# Patient Record
Sex: Female | Born: 1965 | Race: White | Hispanic: No | Marital: Married | Smoking: Never smoker
Health system: Southern US, Community
[De-identification: ages and names within clinical notes are randomized; demographics above are authoritative.]

## PROBLEM LIST (undated history)

## (undated) DIAGNOSIS — H33319 Horseshoe tear of retina without detachment, unspecified eye: Secondary | ICD-10-CM

## (undated) DIAGNOSIS — E7800 Pure hypercholesterolemia, unspecified: Secondary | ICD-10-CM

## (undated) DIAGNOSIS — R7989 Other specified abnormal findings of blood chemistry: Secondary | ICD-10-CM

## (undated) DIAGNOSIS — R3129 Other microscopic hematuria: Secondary | ICD-10-CM

## (undated) DIAGNOSIS — H269 Unspecified cataract: Secondary | ICD-10-CM

## (undated) DIAGNOSIS — F419 Anxiety disorder, unspecified: Secondary | ICD-10-CM

## (undated) DIAGNOSIS — N809 Endometriosis, unspecified: Secondary | ICD-10-CM

## (undated) HISTORY — DX: Anxiety disorder, unspecified: F41.9

## (undated) HISTORY — DX: Endometriosis, unspecified: N80.9

## (undated) HISTORY — DX: Other specified abnormal findings of blood chemistry: R79.89

## (undated) HISTORY — DX: Horseshoe tear of retina without detachment, unspecified eye: H33.319

## (undated) HISTORY — DX: Pure hypercholesterolemia, unspecified: E78.00

## (undated) HISTORY — DX: Other microscopic hematuria: R31.29

## (undated) HISTORY — DX: Unspecified cataract: H26.9

---

## 1997-12-23 ENCOUNTER — Other Ambulatory Visit: Admission: RE | Admit: 1997-12-23 | Discharge: 1997-12-23 | Payer: Self-pay | Admitting: Obstetrics and Gynecology

## 1999-10-16 ENCOUNTER — Inpatient Hospital Stay (HOSPITAL_COMMUNITY): Admission: AD | Admit: 1999-10-16 | Discharge: 1999-10-19 | Payer: Self-pay | Admitting: Gynecology

## 2001-10-10 ENCOUNTER — Inpatient Hospital Stay (HOSPITAL_COMMUNITY): Admission: AD | Admit: 2001-10-10 | Discharge: 2001-10-12 | Payer: Self-pay | Admitting: Gynecology

## 2001-12-09 ENCOUNTER — Other Ambulatory Visit: Admission: RE | Admit: 2001-12-09 | Discharge: 2001-12-09 | Payer: Self-pay | Admitting: Gynecology

## 2002-02-16 ENCOUNTER — Emergency Department (HOSPITAL_COMMUNITY): Admission: EM | Admit: 2002-02-16 | Discharge: 2002-02-16 | Payer: Self-pay | Admitting: Emergency Medicine

## 2002-02-16 ENCOUNTER — Encounter: Payer: Self-pay | Admitting: Emergency Medicine

## 2003-02-25 ENCOUNTER — Other Ambulatory Visit: Admission: RE | Admit: 2003-02-25 | Discharge: 2003-02-25 | Payer: Self-pay | Admitting: Obstetrics and Gynecology

## 2003-06-10 HISTORY — PX: OTHER SURGICAL HISTORY: SHX169

## 2004-05-11 ENCOUNTER — Other Ambulatory Visit: Admission: RE | Admit: 2004-05-11 | Discharge: 2004-05-11 | Payer: Self-pay | Admitting: Obstetrics and Gynecology

## 2006-02-07 ENCOUNTER — Other Ambulatory Visit: Admission: RE | Admit: 2006-02-07 | Discharge: 2006-02-07 | Payer: Self-pay | Admitting: Obstetrics and Gynecology

## 2007-07-17 ENCOUNTER — Other Ambulatory Visit: Admission: RE | Admit: 2007-07-17 | Discharge: 2007-07-17 | Payer: Self-pay | Admitting: Obstetrics and Gynecology

## 2008-05-06 ENCOUNTER — Encounter: Admission: RE | Admit: 2008-05-06 | Discharge: 2008-05-06 | Payer: Self-pay | Admitting: Obstetrics and Gynecology

## 2008-05-13 ENCOUNTER — Encounter: Admission: RE | Admit: 2008-05-13 | Discharge: 2008-05-13 | Payer: Self-pay | Admitting: Obstetrics and Gynecology

## 2008-09-23 ENCOUNTER — Other Ambulatory Visit: Admission: RE | Admit: 2008-09-23 | Discharge: 2008-09-23 | Payer: Self-pay | Admitting: Obstetrics and Gynecology

## 2009-07-10 HISTORY — PX: LAPAROSCOPIC OOPHERECTOMY: SHX6507

## 2009-08-11 ENCOUNTER — Encounter: Admission: RE | Admit: 2009-08-11 | Discharge: 2009-08-11 | Payer: Self-pay | Admitting: Obstetrics and Gynecology

## 2010-02-28 ENCOUNTER — Ambulatory Visit: Payer: Self-pay | Admitting: Diagnostic Radiology

## 2010-02-28 ENCOUNTER — Emergency Department (HOSPITAL_BASED_OUTPATIENT_CLINIC_OR_DEPARTMENT_OTHER): Admission: EM | Admit: 2010-02-28 | Discharge: 2010-02-28 | Payer: Self-pay | Admitting: Emergency Medicine

## 2010-03-01 ENCOUNTER — Ambulatory Visit (HOSPITAL_COMMUNITY): Admission: AD | Admit: 2010-03-01 | Discharge: 2010-03-01 | Payer: Self-pay | Admitting: Obstetrics and Gynecology

## 2010-03-01 ENCOUNTER — Ambulatory Visit (HOSPITAL_COMMUNITY): Admission: RE | Admit: 2010-03-01 | Discharge: 2010-03-01 | Payer: Self-pay | Admitting: Obstetrics & Gynecology

## 2010-03-01 HISTORY — PX: PELVIC LAPAROSCOPY: SHX162

## 2010-07-31 ENCOUNTER — Encounter: Payer: Self-pay | Admitting: Obstetrics and Gynecology

## 2010-09-14 ENCOUNTER — Other Ambulatory Visit: Payer: Self-pay | Admitting: Obstetrics and Gynecology

## 2010-09-14 DIAGNOSIS — Z1231 Encounter for screening mammogram for malignant neoplasm of breast: Secondary | ICD-10-CM

## 2010-09-22 LAB — CBC
MCV: 90.3 fL (ref 78.0–100.0)
Platelets: 225 10*3/uL (ref 150–400)

## 2010-09-23 LAB — DIFFERENTIAL
Basophils Absolute: 0.1 10*3/uL (ref 0.0–0.1)
Eosinophils Absolute: 0.1 10*3/uL (ref 0.0–0.7)
Eosinophils Relative: 1 % (ref 0–5)
Lymphocytes Relative: 23 % (ref 12–46)
Monocytes Absolute: 0.4 10*3/uL (ref 0.1–1.0)
Neutrophils Relative %: 71 % (ref 43–77)

## 2010-09-23 LAB — CBC
Hemoglobin: 14.2 g/dL (ref 12.0–15.0)
MCHC: 33.6 g/dL (ref 30.0–36.0)
Platelets: 235 10*3/uL (ref 150–400)
RDW: 13 % (ref 11.5–15.5)
WBC: 7.7 10*3/uL (ref 4.0–10.5)

## 2010-09-23 LAB — URINALYSIS, ROUTINE W REFLEX MICROSCOPIC
Bilirubin Urine: NEGATIVE
Hgb urine dipstick: NEGATIVE
Ketones, ur: NEGATIVE mg/dL
Protein, ur: NEGATIVE mg/dL

## 2010-09-23 LAB — COMPREHENSIVE METABOLIC PANEL
Calcium: 9 mg/dL (ref 8.4–10.5)
Creatinine, Ser: 0.9 mg/dL (ref 0.4–1.2)
Potassium: 4.3 mEq/L (ref 3.5–5.1)
Sodium: 141 mEq/L (ref 135–145)
Total Bilirubin: 0.7 mg/dL (ref 0.3–1.2)
Total Protein: 7.9 g/dL (ref 6.0–8.3)

## 2010-09-23 LAB — LIPASE, BLOOD: Lipase: 62 U/L (ref 23–300)

## 2010-09-23 LAB — WET PREP, GENITAL: Clue Cells Wet Prep HPF POC: NONE SEEN

## 2010-09-23 LAB — GC/CHLAMYDIA PROBE AMP, GENITAL: GC Probe Amp, Genital: NEGATIVE

## 2010-11-02 ENCOUNTER — Ambulatory Visit
Admission: RE | Admit: 2010-11-02 | Discharge: 2010-11-02 | Disposition: A | Discharge status: Home / Self Care | Payer: 59 | Source: Ambulatory Visit | Attending: Obstetrics and Gynecology | Admitting: Obstetrics and Gynecology

## 2010-11-02 DIAGNOSIS — Z1231 Encounter for screening mammogram for malignant neoplasm of breast: Secondary | ICD-10-CM

## 2010-11-25 NOTE — Discharge Summary (Signed)
[facility]  Patient:    Kristin Greene, Kristin Greene                 MRN: [id number] Adm. Date:  [id number] Disc. Date: [id number] Attending:  Tonye Royalty Dictator:   Antony Contras, RNC, [facility]                           Discharge Summary  DISCHARGE DIAGNOSES:          1. Intrauterine pregnancy at term.                               2. Delivery of viable infant.  PROCEDURE:                    Normal spontaneous vaginal delivery over a midline episiotomy with third degree extension.  HISTORY OF PRESENT ILLNESS:   The patient is a 45 year old primigravida with an LMP of January 19, 1999, [facility] October 26, 1999.  Prenatal course was complicated by an echogenic focus in the left ventricle of the fetal heart which was seen on ultrasound.  The patient subsequently had a normal amniocentesis.  She is also h negative.  PRENATAL LABORATORY DATA:     Blood type O negative, antibody screen negative, rubella positive, RPR, HBSAG, HIV negative.  MSAFP within normal limits. Normal genetic amniocentesis.  GBS negative.  HOSPITAL COURSE:              The patient was admitted with spontaneous rupture of membranes on October 17, 1999, with clear amniotic fluid.  The patient was completely dilated, completely effaced, at +1 station.  She rapidly progressed to delivery of Apgars 9 and 34 female infant, weight 6 pounds 14 ounces over a midline episiotomy  with a third degree extension.  Estimated blood loss was 300 cc.  Postpartum course was within normal limits, the patient remained afebrile.  The baby was B positive. CBC was 25.9, hemoglobin 9.1, WBC 10.8, and platelets 233.  The patient did receive RhoGAM prior to discharge.  She was able to be discharged on her second postpartum day in satisfactory condition with her infant.  DISPOSITION:                  Follow up in six weeks.  Continue prenatal vitamins and iron, and Motrin for pain. DD:  11/04/99 TD:   11/09/99 Job: [id number]

## 2010-11-25 NOTE — Discharge Summary (Signed)
[facility]  Patient:    Kristin Greene, Kristin Greene Visit Number: [id number] MRN: [id number]          Service Type: OBS Location: [id number] Attending Physician:  Merrily Pew Dictated by:   Antony Contras, [facility] Admit Date:  10/10/2001 Discharge Date: 10/12/2001                             Discharge Summary  DISCHARGE DIAGNOSIS:          Intrauterine pregnancy at term, spontaneous onset of labor.  PROCEDURE:                    Normal spontaneous vaginal delivery of viable infant over intact perineum with repair of midline laceration.  HISTORY OF PRESENT ILLNESS:   The patient is a 45 year old gravida 2, para 1-0-0-1 with LMP January 10, 2001 and [facility] October 16, 2001.  Prenatal risk factors include history of beta Strep UTI during current pregnancy, Rh negative, advanced maternal age.  Labs:  Blood type O-, antibody screen negative, toxo negative.  RPR, HBsAg, HIV nonreactive.  Amniocentesis normal chromosomes. GBS positive.  HOSPITAL COURSE:              The patient was admitted October 10, 2001 with spontaneous onset of labor.  She quickly progressed to complete dilatation. Delivered an Apgar 31 and 19 female infant weighing 7 pounds and 4 ounces over intact perineum with repair of second-degree midline laceration.  POSTPARTUM COURSE:            The patient remained afebrile, had no difficulty voiding, was able to be discharged on her second postpartum day in satisfactory condition.  She did receive RhoGAM prior to discharge.  LABORATORY DATA:              CBC:  Hematocrit 34.5, hemoglobin 11.8, WBC 16.3, platelets 212.  DISPOSITION:                  Follow up in six weeks.  Continue prenatal vitamins and iron and Motrin for pain. Dictated by:   Antony Contras, [facility] Attending Physician:  Merrily Pew DD:  10/31/01 TD:  11/01/01 Job: [id number]

## 2011-02-10 ENCOUNTER — Other Ambulatory Visit: Payer: Self-pay | Admitting: Obstetrics and Gynecology

## 2011-02-10 ENCOUNTER — Ambulatory Visit (HOSPITAL_COMMUNITY)
Admission: RE | Admit: 2011-02-10 | Discharge: 2011-02-10 | Disposition: A | Discharge status: Home / Self Care | Payer: 59 | Source: Ambulatory Visit | Attending: Obstetrics and Gynecology | Admitting: Obstetrics and Gynecology

## 2011-02-10 DIAGNOSIS — N83209 Unspecified ovarian cyst, unspecified side: Secondary | ICD-10-CM

## 2011-02-10 DIAGNOSIS — Z30431 Encounter for routine checking of intrauterine contraceptive device: Secondary | ICD-10-CM | POA: Insufficient documentation

## 2011-02-10 DIAGNOSIS — R1031 Right lower quadrant pain: Secondary | ICD-10-CM | POA: Insufficient documentation

## 2011-11-27 ENCOUNTER — Other Ambulatory Visit: Payer: Self-pay | Admitting: Obstetrics and Gynecology

## 2011-11-27 DIAGNOSIS — Z1231 Encounter for screening mammogram for malignant neoplasm of breast: Secondary | ICD-10-CM

## 2012-05-01 ENCOUNTER — Ambulatory Visit
Admission: RE | Admit: 2012-05-01 | Discharge: 2012-05-01 | Disposition: A | Discharge status: Home / Self Care | Payer: BC Managed Care – PPO | Source: Ambulatory Visit | Attending: Obstetrics and Gynecology | Admitting: Obstetrics and Gynecology

## 2012-05-01 DIAGNOSIS — Z1231 Encounter for screening mammogram for malignant neoplasm of breast: Secondary | ICD-10-CM

## 2013-03-24 ENCOUNTER — Other Ambulatory Visit: Payer: Self-pay

## 2013-03-24 DIAGNOSIS — Z1231 Encounter for screening mammogram for malignant neoplasm of breast: Secondary | ICD-10-CM

## 2013-05-14 ENCOUNTER — Ambulatory Visit (INDEPENDENT_AMBULATORY_CARE_PROVIDER_SITE_OTHER): Payer: BC Managed Care – PPO | Admitting: Obstetrics and Gynecology

## 2013-05-14 ENCOUNTER — Ambulatory Visit: Payer: Self-pay | Admitting: Obstetrics and Gynecology

## 2013-05-14 ENCOUNTER — Encounter: Payer: Self-pay | Admitting: Obstetrics and Gynecology

## 2013-05-14 ENCOUNTER — Ambulatory Visit
Admission: RE | Admit: 2013-05-14 | Discharge: 2013-05-14 | Disposition: A | Discharge status: Home / Self Care | Payer: BC Managed Care – PPO | Source: Ambulatory Visit

## 2013-05-14 ENCOUNTER — Ambulatory Visit: Payer: BC Managed Care – PPO

## 2013-05-14 VITALS — BP 118/80 | HR 70 | Ht 66.5 in | Wt 137.5 lb

## 2013-05-14 DIAGNOSIS — Z30431 Encounter for routine checking of intrauterine contraceptive device: Secondary | ICD-10-CM

## 2013-05-14 DIAGNOSIS — Z0000 Encounter for general adult medical examination without abnormal findings: Secondary | ICD-10-CM

## 2013-05-14 DIAGNOSIS — Z01419 Encounter for gynecological examination (general) (routine) without abnormal findings: Secondary | ICD-10-CM

## 2013-05-14 DIAGNOSIS — Z1231 Encounter for screening mammogram for malignant neoplasm of breast: Secondary | ICD-10-CM

## 2013-05-14 LAB — POCT URINALYSIS DIPSTICK
Glucose, UA: NEGATIVE
Ketones, UA: NEGATIVE
Leukocytes, UA: NEGATIVE
Nitrite, UA: NEGATIVE
Protein, UA: NEGATIVE
Urobilinogen, UA: NEGATIVE
pH, UA: 5

## 2013-05-14 LAB — HEMOGLOBIN, FINGERSTICK: Hemoglobin, fingerstick: 13.8 g/dL (ref 12.0–16.0)

## 2013-05-14 NOTE — Progress Notes (Signed)
Patient ID: Kristin Greene, female   DOB: [birth year], 47 y.o.   MRN: [id number] GYNECOLOGY VISIT  PCP:   Creola Corn, MD  Referring provider:   HPI: 47 y.o.   Married  Caucasian  female   G2P2002 with Patient's last menstrual period was 07/10/2002.   here for  AEX.  Mirena IUD expired in October 2014. Wants to have a new IUD. Not sexually active very often, once a year or less.  Husband has issues.  Last IUD exchange was difficulty.  Took antianxiety medication.   Leg shaking and anxiety.  Patient has panic with giving injections at work. Took Prozac and did not like how it took her emotions away. Xanax helps her to deal with this. Has seen a Veterinary surgeon.   Does not need a refill at this time.   Has a history of an ovarian torsion.   Hgb:     13.8 Urine:   Neg  GYNECOLOGIC HISTORY: Patient's last menstrual period was 07/10/2002. Sexually active:  yes Partner preference: female Contraception:   Mirena inserted10-21-09 Menopausal hormone therapy: n/a DES exposure:  no  Blood transfusions:   no Sexually transmitted diseases:  no  GYN Procedures:  Laparoscopy/LSO 06/2003 secondary to torsion and endometriosis Mammogram:    05-14-13 with [facility]             Pap:   05-01-12 wnl:neg HR HPV History of abnormal pap smear:  no   OB History   Grav Para Term Preterm Abortions TAB SAB Ect Mult Living   2 2 2       2        LIFESTYLE: Exercise:    walking           Tobacco:    no Alcohol:       2 drinks per mont Drug use:    no  OTHER HEALTH MAINTENANCE: Tetanus/TDap:   04-06-10 Gardisil:  NA Influenza:    04/2013 Zostavax:   n/a  Bone density:  never Colonoscopy:  never  Cholesterol check:  03/2013 borderline at 220.    Family History  Problem Relation Age of Onset  . Hypertension Father   . Heart failure Maternal Grandmother     CHF  . Heart failure Paternal Grandmother     CHF  . Rheum arthritis Paternal Grandmother   . Hyperlipidemia Mother   .  Osteoporosis Mother     There are no active problems to display for this patient.  Past Medical History  Diagnosis Date  . Endometriosis   . Anxiety     panic    Past Surgical History  Procedure Laterality Date  . Pelvic laparoscopy  03-01-10    LSO secondary torsion, endometriosis--Dr. Aram Beecham Romine  . Abdomnioplasty  06/2003    ALLERGIES: Sulfa antibiotics and Penicillins  Current Outpatient Prescriptions  Medication Sig Dispense Refill  . ALPRAZolam (XANAX) 0.25 MG tablet Take 0.25 mg by mouth as needed for sleep.      Marland Kitchen levonorgestrel (MIRENA) 20 MCG/24HR IUD 1 each by Intrauterine route once.       No current facility-administered medications for this visit.     ROS:  Pertinent items are noted in HPI.  SOCIAL HISTORY:  Dentist.  PHYSICAL EXAMINATION:    BP 118/80  Pulse 70  Ht 5' 6.5" (1.689 m)  Wt 137 lb 8 oz (62.37 kg)  BMI 21.86 kg/m2  LMP 07/10/2002   Wt Readings from Last 3 Encounters:  05/14/13 137 lb 8 oz (  62.37 kg)     Ht Readings from Last 3 Encounters:  05/14/13 5' 6.5" (1.689 m)    General appearance: alert, cooperative and appears stated age Head: Normocephalic, without obvious abnormality, atraumatic Neck: no adenopathy, supple, symmetrical, trachea midline and thyroid not enlarged, symmetric, no tenderness/mass/nodules Lungs: clear to auscultation bilaterally Breasts: Inspection negative, No nipple retraction or dimpling, No nipple discharge or bleeding, No axillary or supraclavicular adenopathy, Normal to palpation without dominant masses Heart: regular rate and rhythm Abdomen: soft, non-tender; no masses,  no organomegaly Extremities: extremities normal, atraumatic, no cyanosis or edema Skin: Skin color, texture, turgor normal. No rashes or lesions Lymph nodes: Cervical, supraclavicular, and axillary nodes normal. No abnormal inguinal nodes palpated Neurologic: Grossly normal  Pelvic: External genitalia:  no lesions               Urethra:  normal appearing urethra with no masses, tenderness or lesions              Bartholins and Skenes: normal                 Vagina: normal appearing vagina with normal color and discharge, no lesions              Cervix: normal appearance.  IUD strings noted.               Pap and high risk HPV testing done: no.            Bimanual Exam:  Uterus:  uterus is normal size, shape, consistency and nontender                                      Adnexa: normal adnexa in size, nontender and no masses                                      Rectovaginal: Confirms                                      Anus:  normal sphincter tone, no lesions  ASSESSMENT  Normal gynecologic exam. Mirena IUD expired.   Not very sexually active.  Anxiety affects patient professionally from time to time.  Has had evaluation. History of ovarian torsion.   PLAN  Mammogram was done today.    Pap smear and high risk HPV testing not indicated.    Discussion of Mirena IUD exchange versus low dose OCPS.   Counseled on Mirena IUD removal and reinsertion.   Patient told to use condoms for contraception if sexually active prior to IUD insertion or to abstain.  Will plan to have patient take Xanax 0.25 mg, take 2, 30 minutes prior to procedure.  Will plan to use procedure room and do a paracervical block.  Told to eat and take 600 - 800 mg OTC ibuprofen before appointment.  Patient will also need to sign a consent form prior to coming in for her procedure.   If patient contacts the office for Xanax, will give a limited prescription.  Return annually or prn   An After Visit Summary was printed and given to the patient.

## 2013-05-14 NOTE — Patient Instructions (Signed)

## 2013-05-30 ENCOUNTER — Telehealth: Payer: Self-pay | Admitting: Obstetrics and Gynecology

## 2013-05-30 NOTE — Telephone Encounter (Signed)
Patient calling to inquire about precert for Mirena?

## 2013-05-30 NOTE — Telephone Encounter (Signed)
Message left to return call to Kristin Greene at [phone].    

## 2013-05-30 NOTE — Telephone Encounter (Signed)
Carolynn, I can see precert is completed, but I can't see a patient amount? Is there one?

## 2013-06-02 NOTE — Telephone Encounter (Signed)
Spoke with patient. She has Mirena currently in place. She is abstaining from sex since expired. She has Xanax at home. She will take it if neccessary and come and sign consent forms prior. Appointment scheduled for 12/24 at 11. Procedure room scheduled as well. She requests Wednesday appointment only since off that day. First available is 12/24.

## 2013-06-02 NOTE — Telephone Encounter (Signed)
Please be sure that I have plenty of time for this procedure.  Patient had a difficult insertion the last time per her report.  Thanks!

## 2013-06-03 NOTE — Telephone Encounter (Signed)
Patient cancel for December 24 and wants to reschedule for january

## 2013-06-03 NOTE — Telephone Encounter (Signed)
French Ana,  I can see the patient on 06/11/13 at 8:00 am and open a slot at that time if it is better for her.  Otherwise, the January appointment is OK.

## 2013-06-03 NOTE — Telephone Encounter (Signed)
Spoke with pt about appt time. Pt offered 07-11-13 at 7 am but declined as she has to work. Scheduled 07-16-13 at 10 am as pt has off on Wednesdays. This is a 30 minute slot. Is that enough time?

## 2013-06-03 NOTE — Telephone Encounter (Signed)
LMTCB for appt options.  aa   (Considering 07-11-13 at 7am--hour long slot, 07-16-13 at 10 am--30 min slot, 07-21-12 at 7 am, 12:45, or 1:45.)

## 2013-06-03 NOTE — Telephone Encounter (Signed)
Kristin Greene and Kristin Greene,  06/11/13 at 8:00 am will be an option for me to do the IUD exchange if it works for the patient.  [employer]

## 2013-06-03 NOTE — Telephone Encounter (Signed)
Dr. Edward Jolly,  I scheduled her for Wednesday December 24 at 11:00. The patient requested only Wednesday appointments and she was not happy with having to wait until 12/24 because her mirena is expired, but this was the first available Wednesday with the time available. She will be the last patient of the day. Will this work? I feel that she will be very upset if I attempt to put her into January.

## 2013-06-04 NOTE — Telephone Encounter (Signed)
I spoke with patient and she is agreeable to that time and date. Instructions given. She will come early to sign release. She has xanax to take at home. Motrin instructions given. Motrin=Advil=Ibuprofen 800 mg (Can purchase over the counter, you will need four 200 mg pills)  Take with food. She has xanax at home and will take as prescribed by Dr. Edward Jolly at visit. Will come while on her lunch break to sign consent.   Thank you very much Dr. Edward Jolly, patient is very appreciative for early time slot!

## 2013-06-09 ENCOUNTER — Telehealth: Payer: Self-pay | Admitting: *Deleted

## 2013-06-09 NOTE — Telephone Encounter (Signed)
Patient came to office and signed consent for Mirena insertion.  She states she has her own medication to take prior to appointment on Wednesday.  Routing to provider for final review. Patient agreeable to disposition. Will close encounter

## 2013-06-11 ENCOUNTER — Ambulatory Visit (INDEPENDENT_AMBULATORY_CARE_PROVIDER_SITE_OTHER): Payer: BC Managed Care – PPO | Admitting: Obstetrics and Gynecology

## 2013-06-11 ENCOUNTER — Encounter: Payer: Self-pay | Admitting: Obstetrics and Gynecology

## 2013-06-11 VITALS — BP 118/70 | HR 84 | Ht 66.5 in | Wt 140.0 lb

## 2013-06-11 DIAGNOSIS — N912 Amenorrhea, unspecified: Secondary | ICD-10-CM

## 2013-06-11 DIAGNOSIS — Z30433 Encounter for removal and reinsertion of intrauterine contraceptive device: Secondary | ICD-10-CM

## 2013-06-11 LAB — POCT URINE PREGNANCY: Preg Test, Ur: NEGATIVE

## 2013-06-11 NOTE — Progress Notes (Signed)
Patient ID: Kristin Greene, female   DOB: [birth year], 47 y.o.   MRN: [id number]   Subjective  Here for IUD removal and exchange. Mirena IUD expired in October 2014.  Consent was signed on 06/09/13.  Took Xanax 0.25 mg, took 2 this am. Took ibuprofen 800 mg this am also.  No menses.   Not having frequent intercourse.    Objective  UPT negative  BP 118/70 Pulse 84  Exam - small mid position uterus, nontender.  No adnexal masses, nontender.  Procedure - IUD removal and reinsertion of new Mirena IUD.   Lot [id number], Expiration 02/2015.  Verbal consent re-reviewed with patient.  Graves speculum placed.  Sterile prep of cervix with Hibiclens.   Paracervical block performed with 10 cc total of lidocaine 1% plain. Tenaculum to anterior cervical lip. IUD removed without difficulty and discarded. Uterus sounded to just over 8 cm. Mirena IUD placed without difficulty. Strings trimmed. Bimanual exam repeated.  No change other than IUD strings palpable. No complications. Tolerated well.  Assesment  Mirena IUD removed. New Mirena IUD placed.  Plan  Instructions/precautions given. Patient will follow up in 4 weeks and sooner prn. Patient's mother is here to drive the patient home today.

## 2013-06-11 NOTE — Patient Instructions (Signed)
Intrauterine Device Insertion Most often, an intrauterine device (IUD) is inserted into the uterus to prevent pregnancy. There are 2 types of IUDs available:  Copper IUD. This type of IUD creates an environment that is not favorable to sperm survival. The mechanism of action of the copper IUD is not known for certain. It can stay in place for 10 years.  Hormone IUD. This type of IUD contains the hormone progestin (synthetic progesterone). The progestin thickens the cervical mucus and prevents sperm from entering the uterus, and it also thins the uterine lining. There is no evidence that the hormone IUD prevents implantation. The hormone IUD can stay in place for up to 5 years. An IUD is the most cost-effective birth control if left in place for the full duration. It may be removed at any time. LET YOUR CAREGIVER KNOW ABOUT:  Sensitivity to metals.  Medicines taken including herbs, eyedrops, over-the-counter medicines, and creams.  Use of steroids (by mouth or creams).  Previous problems with anesthetics or numbing medicine.  Previous gynecological surgery.  History of blood clots or clotting disorders.  Possibility of pregnancy.  Menstrual irregularities.  Concerns regarding unusual vaginal discharge or odors.  Previous experience with an IUD.  Other health problems. RISKS AND COMPLICATIONS  Accidental puncture (perforation) of the uterus.  Accidental placement of the IUD either in the muscle layer of the uterus (myometrium) or outside the uterus. If this happen, the IUD can be found essentially floating around the bowels. When this happens, the IUD must be taken out surgically.  The IUD may fall out of the uterus (expulsion). This is more common in women who have recently had a child.   Pregnancy in the fallopian tube (ectopic). BEFORE THE PROCEDURE  Schedule the IUD insertion for when you will have your menstrual period or right after, to make sure you are not pregnant.  Placement of the IUD is better tolerated shortly after a menstrual cycle.  You may need to take tests or be examined to make sure you are not pregnant.  You may be required to take a pregnancy test.  You may be required to get checked for sexually transmitted infections (STIs) prior to placement. Placing an IUD in someone who has an infection can make an infection worse.  You may be given a pain reliever to take 1 or 2 hours before the procedure.  An exam will be performed to determine the size and position of your uterus.  Ask your caregiver about changing or stopping your regular medicines. PROCEDURE   A tool (speculum) is placed in the vagina. This allows your caregiver to see the lower part of the uterus (cervix).  The cervix is prepped with a medicine that lowers the risk of infection.  You may be given a medicine to numb each side of the cervix (intracervical or paracervical block). This is used to block and control any discomfort with insertion.  A tool (uterine sound) is inserted into the uterus to determine the length of the uterine cavity and the direction the uterus may be tilted.  A slim instrument (IUD inserter) is inserted through the cervical canal and into your uterus.  The IUD is placed in the uterine cavity and the insertion device is removed.  The nylon string that is attached to the IUD, and used for eventual IUD removal, is trimmed. It is trimmed so that it lays high in the vagina, just outside the cervix. AFTER THE PROCEDURE  You may have bleeding after the   procedure. This is normal. It varies from light spotting for a few days to menstrual-like bleeding.  You may have mild cramping.  Practice checking the string coming out of the cervix to make sure the IUD remains in the uterus. If you cannot feel the string, you should schedule a "string check" with your caregiver.  If you had a hormone IUD inserted, expect that your period may be lighter or nonexistent  within a year's time (though this is not always the case). There may be delayed fertility with the hormone IUD as a result of its progesterone effect. When you are ready to become pregnant, it is suggested to have the IUD removed up to 1 year in advance.  Yearly exams are advised. Document Released: 02/22/2011 Document Revised: 09/18/2011 Document Reviewed: 12/15/2012 [employer] Patient Information 2014 [employer].  

## 2013-07-02 ENCOUNTER — Ambulatory Visit: Payer: BC Managed Care – PPO | Admitting: Obstetrics and Gynecology

## 2013-07-02 ENCOUNTER — Ambulatory Visit: Payer: BC Managed Care – PPO

## 2013-07-16 ENCOUNTER — Ambulatory Visit: Payer: BC Managed Care – PPO | Admitting: Obstetrics and Gynecology

## 2013-07-22 ENCOUNTER — Telehealth: Payer: Self-pay | Admitting: Obstetrics and Gynecology

## 2013-07-22 NOTE — Telephone Encounter (Signed)
Pt would like to reschedule her follow up appt for reck scheduled for tomorrow with dr Quincy Simmonds because of the weather. Would like to come in next Wednesday if possible.

## 2013-07-22 NOTE — Telephone Encounter (Signed)
Spoke with pt to reschedule appt to next Wed. 07-30-13 at 10 am with BS. Pt agreeable.

## 2013-07-23 ENCOUNTER — Ambulatory Visit: Payer: BC Managed Care – PPO | Admitting: Obstetrics and Gynecology

## 2013-07-30 ENCOUNTER — Encounter: Payer: Self-pay | Admitting: Obstetrics and Gynecology

## 2013-07-30 ENCOUNTER — Ambulatory Visit (INDEPENDENT_AMBULATORY_CARE_PROVIDER_SITE_OTHER): Payer: BC Managed Care – PPO | Admitting: Obstetrics and Gynecology

## 2013-07-30 VITALS — BP 118/71 | HR 74 | Resp 16 | Wt 140.0 lb

## 2013-07-30 DIAGNOSIS — Z30431 Encounter for routine checking of intrauterine contraceptive device: Secondary | ICD-10-CM

## 2013-07-30 NOTE — Progress Notes (Signed)
Patient ID: Kristin Greene, female   DOB: 04-02-1966, 48 y.o.   MRN: [id number]  Subjective   Here for Mirena IUD check.  Mirena IUD eschange performed on 06/11/13. Had brief bleeding with procedure and then none since. No pain or cramping.  Has not checked IUD strings. Not SA.  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  Objective  Pelvic exam Normal external genitalia and urethra.  Cervix - IUD strings seen.  Bimanual examination - No IUD palpable.  Small and nontender uterus.  Exam somewhat difficult due to patient inability to relax abdominal wall. No adnexal masses.  Assessment  Doing well with Mirena IUD.  Plan  Follow up for any problems and for annual examination in November 2015.  10 minutes face to face time of which over 50% was spent in counseling.

## 2013-07-30 NOTE — Patient Instructions (Signed)
Please call for any problems!

## 2013-09-10 ENCOUNTER — Other Ambulatory Visit: Payer: Self-pay | Admitting: Obstetrics and Gynecology

## 2013-09-10 MED ORDER — ALPRAZOLAM 0.25 MG PO TABS: 0.2500 mg | ORAL_TABLET | ORAL | Status: DC | PRN

## 2013-09-10 NOTE — Telephone Encounter (Signed)
Please advise, Annual Exam 05/14/2013   Says  if patient contacts the office for refill will give a limited Rx.

## 2013-09-10 NOTE — Telephone Encounter (Signed)
Xanax refill requested by patient. [place]

## 2013-09-11 NOTE — Telephone Encounter (Signed)
Rx faxed to [facility] and left a voice message telling patient.

## 2013-09-12 NOTE — Telephone Encounter (Signed)
T/C to patient to let her know that her Rx was faxed to [facility]. Pt was asking if she could get a refill on OCP that she had taken before.after talking with Dr. Quincy Simmonds I called the patient back and scheduled an appointment to discuss her medication.

## 2013-09-29 ENCOUNTER — Telehealth: Payer: Self-pay | Admitting: Obstetrics and Gynecology

## 2013-09-29 NOTE — Telephone Encounter (Signed)
Patient is asking for refill status. Please call patient when this has been called in. Patient says she requested a refill thru [facility].

## 2013-09-29 NOTE — Telephone Encounter (Signed)
Collier Flowers, CMA at 09/12/2013 5:11 PM    Status: Signed        T/C to patient to let her know that her Rx was faxed to [facility].  Pt was asking if she could get a refill on OCP that she had taken before.after talking with Dr. Quincy Simmonds  I called the patient back and scheduled an appointment to discuss her medication.     -Patient canceled appt she had  10/01/13 to discuss Rx. - She needs OV to discuss refills with Dr. Quincy Simmonds - Left message to pt to call back

## 2013-09-30 NOTE — Telephone Encounter (Signed)
Kristin Greene,  I sent a message to the patient through My Chart asking her what pill she wanted.  I guess she did not receive the message.  Starla,  Please pull the patient's paper chart and place it on my desk on 10/01/13 so I can review it.  Thanks to both,  [place]

## 2013-09-30 NOTE — Telephone Encounter (Signed)
Patient called back and states she send e-mail to Dr. Quincy Simmonds thru [facility] explaining why she uses OCP even when she has an IUD. I am not able to see those e-mails. - Patient states it is not rush. I told her I would send message to Dr. Quincy Simmonds, she will respond by Thursday. We will call pt if Dr. Has any questions. -Patient agreed.  [facility] [address].

## 2013-10-01 ENCOUNTER — Ambulatory Visit: Payer: BC Managed Care – PPO | Admitting: Obstetrics and Gynecology

## 2013-10-01 ENCOUNTER — Other Ambulatory Visit: Payer: Self-pay | Admitting: Obstetrics and Gynecology

## 2013-10-01 MED ORDER — NORETHIN-ETH ESTRAD-FE BIPHAS 1 MG-10 MCG / 10 MCG PO TABS: 1.0000 | ORAL_TABLET | Freq: Every day | ORAL | Status: DC

## 2013-10-01 NOTE — Telephone Encounter (Signed)
Tiffany,  Will you pull this paper chart for me/  Thanks!

## 2013-10-01 NOTE — Telephone Encounter (Signed)
Kristin Greene,   Please inform the patient I will give her a prescription for a pack of LoLoEstrin.  I will send in to her pharmacy through [facility]. I reviewed her paper chart and found that she took this for cyst formation in the past.  [place],   [place].

## 2013-10-02 NOTE — Telephone Encounter (Signed)
Spoke with patient. Advised prescription for LoLoEstrin was sent to pharmacy of choice. Patient verbalizes understanding and is agreeable.  Routing to provider for final review. Patient agreeable to disposition. Will close encounter

## 2013-12-10 ENCOUNTER — Encounter: Payer: Self-pay | Admitting: Obstetrics and Gynecology

## 2013-12-11 ENCOUNTER — Telehealth: Payer: Self-pay | Admitting: *Deleted

## 2013-12-11 NOTE — Telephone Encounter (Signed)
Dr Quincy Simmonds, this is a duplicate of previous message because I believe I sent it to patient incorrectly.  Routing to provider for final review. Patient agreeable to disposition. Will close encounter

## 2013-12-11 NOTE — Telephone Encounter (Signed)
Last TSH 04-2011, in [place] chart. Message to patient needs OV to discuss symptoms.  Routing to provider for final review. Patient agreeable to disposition. Will close encounter

## 2013-12-11 NOTE — Telephone Encounter (Signed)
See separate MyChart message to patient.  Routing to provider for final review. Patient agreeable to disposition. Will close encounter

## 2013-12-24 ENCOUNTER — Ambulatory Visit (INDEPENDENT_AMBULATORY_CARE_PROVIDER_SITE_OTHER): Payer: BC Managed Care – PPO | Admitting: Obstetrics and Gynecology

## 2013-12-24 ENCOUNTER — Encounter: Payer: Self-pay | Admitting: Obstetrics and Gynecology

## 2013-12-24 VITALS — BP 120/68 | HR 70 | Ht 66.5 in | Wt 139.6 lb

## 2013-12-24 DIAGNOSIS — L659 Nonscarring hair loss, unspecified: Secondary | ICD-10-CM

## 2013-12-24 DIAGNOSIS — R55 Syncope and collapse: Secondary | ICD-10-CM

## 2013-12-24 DIAGNOSIS — L708 Other acne: Secondary | ICD-10-CM

## 2013-12-24 DIAGNOSIS — L709 Acne, unspecified: Secondary | ICD-10-CM

## 2013-12-24 MED ORDER — ESTRADIOL 1 MG PO TABS: 1.0000 mg | ORAL_TABLET | Freq: Every day | ORAL | Status: DC

## 2013-12-24 NOTE — Progress Notes (Signed)
Patient ID: Kristin Greene, female   DOB: Sep 11, 1965, 48 y.o.   MRN: [id number] GYNECOLOGY  VISIT   HPI: 48 y.o.   Married  Caucasian  female   G2P2002 with No LMP recorded. Patient is not currently having periods (Reason: IUD).   here for consult.    Notes hair thinning and drying for 6 - 8 months.  Acne.  Saw an esthetician.  Anxiety.  No reason for this.  No hot flashes.  Feels a little warmer at night.   Took OCPs recently only once due to ovarian cyst type pain.   GYNECOLOGIC HISTORY: No LMP recorded. Patient is not currently having periods (Reason: IUD). Contraception:  Mirena IUD  Menopausal hormone therapy: n/a        OB History   Grav Para Term Preterm Abortions TAB SAB Ect Mult Living   2 2 2       2          There are no active problems to display for this patient.   Past Medical History  Diagnosis Date  . Endometriosis   . Anxiety     panic    Past Surgical History  Procedure Laterality Date  . Pelvic laparoscopy  03-01-10    LSO secondary torsion, endometriosis--Dr. Caren Griffins Romine  . Abdomnioplasty  06/2003    Current Outpatient Prescriptions  Medication Sig Dispense Refill  . ALPRAZolam (XANAX) 0.25 MG tablet Take 1 tablet (0.25 mg total) by mouth as needed for sleep.  30 tablet  0  . levonorgestrel (MIRENA) 20 MCG/24HR IUD 1 each by Intrauterine route once.      . Norethindrone-Ethinyl Estradiol-Fe Biphas (LO LOESTRIN FE) 1 MG-10 MCG / 10 MCG tablet Take 1 tablet by mouth daily.  1 Package  0   No current facility-administered medications for this visit.     ALLERGIES: Sulfa antibiotics and Penicillins  Family History  Problem Relation Age of Onset  . Hypertension Father   . Heart failure Maternal Grandmother     CHF  . Heart failure Paternal Grandmother     CHF  . Rheum arthritis Paternal Grandmother   . Hyperlipidemia Mother   . Osteoporosis Mother     History   Social History  . Marital Status: Married    Spouse Name: N/A   Number of Children: N/A  . Years of Education: N/A   Occupational History  . Not on file.   Social History Main Topics  . Smoking status: Never Smoker   . Smokeless tobacco: Never Used  . Alcohol Use: No     Comment: 2 drinks per month  . Drug Use: No  . Sexual Activity: Yes    Partners: Male    Birth Control/ Protection: IUD     Comment: Mirena--inserted10-21-09(Lot #[id number] Exp 08/2010)   Other Topics Concern  . Not on file   Social History Narrative  . No narrative on file    ROS:  Pertinent items are noted in HPI.  PHYSICAL EXAMINATION:    BP 120/68  Pulse 70  Ht 5' 6.5" (1.689 m)  Wt 139 lb 9.6 oz (63.322 kg)  BMI 22.20 kg/m2     General appearance: alert, cooperative and appears stated age  ASSESSMENT  Alopecia.  Acne.  Vasomotor instability.  Mirena IUD patient. Anxiety.   PLAN  Will check FSH and TSH.  I recommend Estradiol 1 mg daily.  See Epic orders.  Risks of breast cancer, DVT, PE, MI and stroke reviewed with the patient.  Will have patient return for a recheck in 3 months, sooner as needed.   An After Visit Summary was printed and given to the patient.  _15_____ minutes face to face time of which over 50% was spent in counseling.

## 2013-12-24 NOTE — Patient Instructions (Signed)
Estradiol tablets What is this medicine? ESTRADIOL (es tra DYE ole) is an estrogen. It is mostly used as hormone replacement in menopausal women. It helps to treat hot flashes and prevent osteoporosis. It is also used to treat women with low estrogen levels or those who have had their ovaries removed. This medicine may be used for other purposes; ask your health care provider or pharmacist if you have questions. COMMON BRAND NAME(S): Estrace, Femtrace, Gynodiol  What should I tell my health care provider before I take this medicine? They need to know if you have or ever had any of these conditions: -abnormal vaginal bleeding -blood vessel disease or blood clots -breast, cervical, endometrial, ovarian, liver, or uterine cancer -dementia -diabetes -gallbladder disease -heart disease or recent heart attack -high blood pressure -high cholesterol -high level of calcium in the blood -hysterectomy -kidney disease -liver disease -migraine headaches -protein C deficiency -protein S deficiency -stroke -systemic lupus erythematosus (SLE) -tobacco smoker -an unusual or allergic reaction to estrogens, other hormones, medicines, foods, dyes, or preservatives -pregnant or trying to get pregnant -breast-feeding How should I use this medicine? Take this medicine by mouth. To reduce nausea, this medicine may be taken with food. Follow the directions on the prescription label. Take this medicine at the same time each day and in the order directed on the package. Do not take your medicine more often than directed. Contact your pediatrician regarding the use of this medicine in children. Special care may be needed. A patient package insert for the product will be given with each prescription and refill. Read this sheet carefully each time. The sheet may change frequently. Overdosage: If you think you have taken too much of this medicine contact a poison control center or emergency room at once. NOTE:  This medicine is only for you. Do not share this medicine with others. What if I miss a dose? If you miss a dose, take it as soon as you can. If it is almost time for your next dose, take only that dose. Do not take double or extra doses. What may interact with this medicine? Do not take this medicine with any of the following medications: -aromatase inhibitors like aminoglutethimide, anastrozole, exemestane, letrozole, testolactone This medicine may also interact with the following medications: -carbamazepine -certain antibiotics used to treat infections -certain barbiturates or benzodiazepines used for inducing sleep or treating seizures -grapefruit juice -medicines for fungus infections like itraconazole and ketoconazole -raloxifene or tamoxifen -rifabutin, rifampin, or rifapentine -ritonavir -St. John's Wort -warfarin This list may not describe all possible interactions. Give your health care provider a list of all the medicines, herbs, non-prescription drugs, or dietary supplements you use. Also tell them if you smoke, drink alcohol, or use illegal drugs. Some items may interact with your medicine. What should I watch for while using this medicine? Visit your doctor or health care professional for regular checks on your progress. You will need a regular breast and pelvic exam and Pap smear while on this medicine. You should also discuss the need for regular mammograms with your health care professional, and follow his or her guidelines for these tests. This medicine can make your body retain fluid, making your fingers, hands, or ankles swell. Your blood pressure can go up. Contact your doctor or health care professional if you feel you are retaining fluid. If you have any reason to think you are pregnant, stop taking this medicine right away and contact your doctor or health care professional. Smoking increases the risk  of getting a blood clot or having a stroke while you are taking this  medicine, especially if you are more than 48 years old. You are strongly advised not to smoke. If you wear contact lenses and notice visual changes, or if the lenses begin to feel uncomfortable, consult your eye doctor or health care professional. This medicine can increase the risk of developing a condition (endometrial hyperplasia) that may lead to cancer of the lining of the uterus. Taking progestins, another hormone drug, with this medicine lowers the risk of developing this condition. Therefore, if your uterus has not been removed (by a hysterectomy), your doctor may prescribe a progestin for you to take together with your estrogen. You should know, however, that taking estrogens with progestins may have additional health risks. You should discuss the use of estrogens and progestins with your health care professional to determine the benefits and risks for you. If you are going to have surgery, you may need to stop taking this medicine. Consult your health care professional for advice before you schedule the surgery. What side effects may I notice from receiving this medicine? Side effects that you should report to your doctor or health care professional as soon as possible: -allergic reactions like skin rash, itching or hives, swelling of the face, lips, or tongue -breast tissue changes or discharge -changes in vision -chest pain -confusion, trouble speaking or understanding -dark urine -general ill feeling or flu-like symptoms -light-colored stools -nausea, vomiting -pain, swelling, warmth in the leg -right upper belly pain -severe headaches -shortness of breath -sudden numbness or weakness of the face, arm or leg -trouble walking, dizziness, loss of balance or coordination -unusual vaginal bleeding -yellowing of the eyes or skin Side effects that usually do not require medical attention (report to your doctor or health care professional if they continue or are bothersome): -hair  loss -increased hunger or thirst -increased urination -symptoms of vaginal infection like itching, irritation or unusual discharge -unusually weak or tired This list may not describe all possible side effects. Call your doctor for medical advice about side effects. You may report side effects to FDA at 1-800-FDA-1088. Where should I keep my medicine? Keep out of the reach of children. Store at room temperature between 20 and 25 degrees C (68 and 77 degrees F). Keep container tightly closed. Protect from light. Throw away any unused medicine after the expiration date. NOTE: This sheet is a summary. It may not cover all possible information. If you have questions about this medicine, talk to your doctor, pharmacist, or health care provider.  2014, [employer]vier/Gold Standard. (2010-09-28 16:38:46)

## 2013-12-25 LAB — FOLLICLE STIMULATING HORMONE: FSH: 11.1 m[IU]/mL

## 2013-12-25 LAB — TSH: TSH: 1.665 u[IU]/mL (ref 0.350–4.500)

## 2014-01-28 ENCOUNTER — Other Ambulatory Visit: Payer: Self-pay | Admitting: Obstetrics and Gynecology

## 2014-01-28 NOTE — Telephone Encounter (Signed)
Last AEX: 05/14/13 Last refill:09/10/13 #30, 0 rf Current AEX:07/15/14  Please advise

## 2014-01-28 NOTE — Telephone Encounter (Signed)
RX faxed to [facility]

## 2014-03-08 ENCOUNTER — Other Ambulatory Visit: Payer: Self-pay | Admitting: Obstetrics and Gynecology

## 2014-03-09 NOTE — Telephone Encounter (Signed)
Last refilled: 01/28/14 #30/0refills Last AEX: 05/14/13 with Dr. Leretha Dykes Scheduled: 07/25/14 with Dr. Quincy Simmonds 3 month recheck scheduled for 03/25/14  Please Advise.

## 2014-03-10 NOTE — Telephone Encounter (Signed)
Adel s/w Pharmacist Mortimer Fries) called in Xanax 0.25 mg Take 1 tablet by mouth at bedtime if needed for sleep #30/0 refills  Thank You!

## 2014-03-10 NOTE — Telephone Encounter (Signed)
Please call this in to patient's pharmacy.  I cannot e prescribe Xanax.

## 2014-03-25 ENCOUNTER — Encounter: Payer: Self-pay | Admitting: Obstetrics and Gynecology

## 2014-03-25 ENCOUNTER — Ambulatory Visit (INDEPENDENT_AMBULATORY_CARE_PROVIDER_SITE_OTHER): Payer: BC Managed Care – PPO | Admitting: Obstetrics and Gynecology

## 2014-03-25 VITALS — BP 120/64 | HR 68 | Ht 66.5 in | Wt 139.0 lb

## 2014-03-25 DIAGNOSIS — F411 Generalized anxiety disorder: Secondary | ICD-10-CM

## 2014-03-25 DIAGNOSIS — R040 Epistaxis: Secondary | ICD-10-CM

## 2014-03-25 MED ORDER — ESTRADIOL 1 MG PO TABS: 1.0000 mg | ORAL_TABLET | Freq: Every day | ORAL | Status: DC

## 2014-03-25 NOTE — Progress Notes (Signed)
GYNECOLOGY  VISIT   HPI: 48 y.o.   Married  Caucasian  female   G2P2002 with No LMP recorded. Patient is not currently having periods (Reason: IUD).   here for hormone recheck.  Hot at night still.  Some improvement in skin and hair. No difference in mood swings.  Took Prozac and Lexapro and had emotional blunting.  Taking xanax for one year.   GYNECOLOGIC HISTORY: No LMP recorded. Patient is not currently having periods (Reason: IUD). Contraception:   no Menopausal hormone therapy: Estradiol        OB History   Grav Para Term Preterm Abortions TAB SAB Ect Mult Living   2 2 2       2          There are no active problems to display for this patient.   Past Medical History  Diagnosis Date  . Endometriosis   . Anxiety     panic    Past Surgical History  Procedure Laterality Date  . Pelvic laparoscopy  03-01-10    LSO secondary torsion, endometriosis--Dr. Caren Griffins Romine  . Abdomnioplasty  06/2003    Current Outpatient Prescriptions  Medication Sig Dispense Refill  . ALPRAZolam (XANAX) 0.25 MG tablet take 1 tablet by mouth at bedtime if needed for sleep  30 tablet  0  . estradiol (ESTRACE) 1 MG tablet Take 1 tablet (1 mg total) by mouth daily.  90 tablet  1  . levonorgestrel (MIRENA) 20 MCG/24HR IUD 1 each by Intrauterine route once.      . Norethindrone-Ethinyl Estradiol-Fe Biphas (LO LOESTRIN FE) 1 MG-10 MCG / 10 MCG tablet Take 1 tablet by mouth daily.  1 Package  0   No current facility-administered medications for this visit.     ALLERGIES: Sulfa antibiotics and Penicillins  Family History  Problem Relation Age of Onset  . Hypertension Father   . Heart failure Maternal Grandmother     CHF  . Heart failure Paternal Grandmother     CHF  . Rheum arthritis Paternal Grandmother   . Hyperlipidemia Mother   . Osteoporosis Mother     History   Social History  . Marital Status: Married    Spouse Name: N/A    Number of Children: N/A  . Years of Education:  N/A   Occupational History  . Not on file.   Social History Main Topics  . Smoking status: Never Smoker   . Smokeless tobacco: Never Used  . Alcohol Use: No     Comment: 2 drinks per month  . Drug Use: No  . Sexual Activity: Yes    Partners: Male    Birth Control/ Protection: IUD     Comment: Mirena--inserted10-21-09(Lot #[id number] Exp 08/2010)   Other Topics Concern  . Not on file   Social History Narrative  . No narrative on file    ROS:  Pertinent items are noted in HPI.  PHYSICAL EXAMINATION:    BP 120/64  Pulse 68  Ht 5' 6.5" (1.689 m)  Wt 139 lb (63.05 kg)  BMI 22.10 kg/m2     General appearance: alert, cooperative and appears stated age  ASSESSMENT Perimenopausal female.  Mirena IUD and Estrace patient.  History of ovarian cysts.  Anxiety. Epistaxis.  PLAN  Will continue with Estrace after discussion of risks and benefits.  Understands that the Estrace will not prevent nor treat ovarian cysts.  Will refer patient to psychiatry for treatment of her anxiety, hopefully through medical therapy to avoid the anxiety  episodes instead of treating them. Referral to Dr. Wilburn Cornelia for epistaxis.    An After Visit Summary was printed and given to the patient.  _25_____ minutes face to face time of which over 50% was spent in counseling.

## 2014-03-26 ENCOUNTER — Encounter: Payer: Self-pay | Admitting: Obstetrics and Gynecology

## 2014-04-10 ENCOUNTER — Other Ambulatory Visit: Payer: Self-pay

## 2014-04-10 DIAGNOSIS — Z1231 Encounter for screening mammogram for malignant neoplasm of breast: Secondary | ICD-10-CM

## 2014-04-14 ENCOUNTER — Encounter: Payer: Self-pay | Admitting: Obstetrics and Gynecology

## 2014-04-20 ENCOUNTER — Other Ambulatory Visit: Payer: Self-pay | Admitting: *Deleted

## 2014-04-20 NOTE — Telephone Encounter (Signed)
Last AEX 05/14/13  Refill 03/10/14 #30/0R Next appt 07/15/14  Please advise.

## 2014-04-21 MED ORDER — ALPRAZOLAM 0.25 MG PO TABS: 0.2500 mg | ORAL_TABLET | Freq: Three times a day (TID) | ORAL | Status: DC | PRN

## 2014-05-11 ENCOUNTER — Encounter: Payer: Self-pay | Admitting: Obstetrics and Gynecology

## 2014-05-27 ENCOUNTER — Ambulatory Visit: Payer: BC Managed Care – PPO | Admitting: Obstetrics and Gynecology

## 2014-07-06 ENCOUNTER — Other Ambulatory Visit: Payer: Self-pay

## 2014-07-06 NOTE — Telephone Encounter (Signed)
Medication refill request: alprazolam 0.25mg  Last AEX:  05/14/13 Next AEX: 07/15/14 Last MMG (if hormonal medication request): NA Refill authorized: until AEX

## 2014-07-08 MED ORDER — ALPRAZOLAM 0.25 MG PO TABS: 0.2500 mg | ORAL_TABLET | Freq: Three times a day (TID) | ORAL | Status: DC | PRN

## 2014-07-08 NOTE — Telephone Encounter (Signed)
Rx printed and signed by Dr. Quincy Simmonds, faxed to [facility].

## 2014-07-08 NOTE — Addendum Note (Signed)
Addended by: Alfonzo Feller on: 07/08/2014 11:51 AM   Modules accepted: Orders

## 2014-07-15 ENCOUNTER — Ambulatory Visit (INDEPENDENT_AMBULATORY_CARE_PROVIDER_SITE_OTHER): Payer: BLUE CROSS/BLUE SHIELD | Admitting: Obstetrics and Gynecology

## 2014-07-15 ENCOUNTER — Encounter: Payer: Self-pay | Admitting: Obstetrics and Gynecology

## 2014-07-15 ENCOUNTER — Ambulatory Visit: Payer: BC Managed Care – PPO | Admitting: Obstetrics and Gynecology

## 2014-07-15 ENCOUNTER — Ambulatory Visit
Admission: RE | Admit: 2014-07-15 | Discharge: 2014-07-15 | Disposition: A | Discharge status: Home / Self Care | Payer: BLUE CROSS/BLUE SHIELD | Source: Ambulatory Visit

## 2014-07-15 VITALS — BP 102/66 | HR 76 | Resp 20 | Ht 66.0 in | Wt 144.0 lb

## 2014-07-15 DIAGNOSIS — Z0000 Encounter for general adult medical examination without abnormal findings: Secondary | ICD-10-CM

## 2014-07-15 DIAGNOSIS — R319 Hematuria, unspecified: Secondary | ICD-10-CM

## 2014-07-15 DIAGNOSIS — Z01419 Encounter for gynecological examination (general) (routine) without abnormal findings: Secondary | ICD-10-CM

## 2014-07-15 DIAGNOSIS — Z1231 Encounter for screening mammogram for malignant neoplasm of breast: Secondary | ICD-10-CM

## 2014-07-15 LAB — POCT URINALYSIS DIPSTICK
Bilirubin, UA: NEGATIVE
Glucose, UA: NEGATIVE
Ketones, UA: NEGATIVE
LEUKOCYTES UA: NEGATIVE
Nitrite, UA: NEGATIVE
PH UA: 5
PROTEIN UA: NEGATIVE
UROBILINOGEN UA: NEGATIVE

## 2014-07-15 NOTE — Patient Instructions (Signed)

## 2014-07-15 NOTE — Progress Notes (Signed)
49 y.o. [id number] MarriedCaucasianF here for annual exam.    No problems with ovarian cysts so she had not taken any OCPs.  Wants to do fasting labs. Has elevated cholesterol not treated with medication.  Total cholesterol about 220.   Saw dermatology and had a skin check.  Will do another check in 6 months.   Happy with Mirena IUD.   Saw Dr. Caprice Beaver today and will start Zoloft for general anxiety disorder.  Will try to wean off Xanax.  Planning on a skiing trip to [place] in February.  Kids would need to be out of school for a week.   No LMP recorded. Patient is not currently having periods (Reason: IUD).          Sexually active: Yes.    The current method of family planning is IUD.    Exercising: Yes.    Cardio 4 - 5 x weekly Smoker:  no  Health Maintenance: Pap:  04/2012 Neg. HR HPV:Neg History of abnormal Pap:  no MMG:  07/15/14 BIRASDS1:neg Colonoscopy:  Never BMD:   Never TDaP: 03/2010 Screening Labs: Will discuss with Dr. Quincy Simmonds first, Urine today: RBC=Trace, PH=5.0 - No dysuria. No history of UTIs.   reports that she has never smoked. She has never used smokeless tobacco. She reports that she drinks alcohol. She reports that she does not use illicit drugs.  Past Medical History  Diagnosis Date  . Endometriosis   . Anxiety     panic    Past Surgical History  Procedure Laterality Date  . Pelvic laparoscopy  03-01-10    LSO secondary torsion, endometriosis--Dr. Caren Griffins Romine  . Abdomnioplasty  06/2003    Current Outpatient Prescriptions  Medication Sig Dispense Refill  . ALPRAZolam (XANAX) 0.25 MG tablet Take 1 tablet (0.25 mg total) by mouth 3 (three) times daily as needed for anxiety. 30 tablet 0  . estradiol (ESTRACE) 1 MG tablet Take 1 tablet (1 mg total) by mouth daily. 90 tablet 1  . levonorgestrel (MIRENA) 20 MCG/24HR IUD 1 each by Intrauterine route once.    . Norethindrone-Ethinyl Estradiol-Fe Biphas (LO LOESTRIN FE) 1 MG-10 MCG / 10 MCG tablet Take 1  tablet by mouth daily. (Patient taking differently: Take 1 tablet by mouth daily as needed (for Ovarian cyst Sx). ) 1 Package 0   No current facility-administered medications for this visit.    Family History  Problem Relation Age of Onset  . Hypertension Father   . Heart failure Maternal Grandmother     CHF  . Heart failure Paternal Grandmother     CHF  . Rheum arthritis Paternal Grandmother   . Hyperlipidemia Mother   . Osteoporosis Mother     ROS:  Pertinent items are noted in HPI.  Otherwise, a comprehensive ROS was negative.  Exam:   BP 102/66 mmHg  Pulse 76  Resp 20  Ht 5\' 6"  (1.676 m)  Wt 144 lb (65.318 kg)  BMI 23.25 kg/m2     Height: 5\' 6"  (167.6 cm)  Ht Readings from Last 3 Encounters:  07/15/14 5\' 6"  (1.676 m)  03/25/14 5' 6.5" (1.689 m)  12/24/13 5' 6.5" (1.689 m)    General appearance: alert, cooperative and appears stated age Head: Normocephalic, without obvious abnormality, atraumatic Neck: no adenopathy, supple, symmetrical, trachea midline and thyroid normal to inspection and palpation Lungs: clear to auscultation bilaterally Breasts: normal appearance, no masses or tenderness, Inspection negative, No nipple retraction or dimpling, No nipple discharge or bleeding, No axillary or  supraclavicular adenopathy Heart: regular rate and rhythm Abdomen: soft, non-tender; bowel sounds normal; no masses,  no organomegaly Extremities: extremities normal, atraumatic, no cyanosis or edema Skin: Skin color, texture, turgor normal. No rashes or lesions Lymph nodes: Cervical, supraclavicular, and axillary nodes normal. No abnormal inguinal nodes palpated Neurologic: Grossly normal   Pelvic: External genitalia:  no lesions              Urethra:  normal appearing urethra with no masses, tenderness or lesions              Bartholins and Skenes: normal                 Vagina: normal appearing vagina with normal color and discharge, no lesions              Cervix: no  lesions.  IUD strings noted.               Pap taken: No. Bimanual Exam:  Uterus:  normal size, contour, position, consistency, mobility, non-tender              Adnexa: normal adnexa and no mass, fullness, tenderness               Rectovaginal: Confirms               Anus:  normal sphincter tone, no lesions  Chaperone was present for exam.  A:  Well Woman with normal exam Mirena IUD patient.  Generalized anxiety disorder.  History of elevated cholesterol.  P:   Mammogram yearly.  pap smear next year.  Return for fasting labs next week.  Continue Zoloft.  Wean off Xanax.  return annually or prn

## 2014-07-16 LAB — URINALYSIS, MICROSCOPIC ONLY
Bacteria, UA: NONE SEEN
Casts: NONE SEEN
Crystals: NONE SEEN
Squamous Epithelial / LPF: NONE SEEN

## 2014-07-17 LAB — URINE CULTURE: Colony Count: 40000

## 2014-07-29 ENCOUNTER — Other Ambulatory Visit (INDEPENDENT_AMBULATORY_CARE_PROVIDER_SITE_OTHER): Payer: BLUE CROSS/BLUE SHIELD

## 2014-07-29 DIAGNOSIS — Z0000 Encounter for general adult medical examination without abnormal findings: Secondary | ICD-10-CM

## 2014-07-29 DIAGNOSIS — Z01419 Encounter for gynecological examination (general) (routine) without abnormal findings: Secondary | ICD-10-CM

## 2014-07-29 LAB — CBC
HEMATOCRIT: 40 % (ref 36.0–46.0)
Hemoglobin: 13.1 g/dL (ref 12.0–15.0)
MCH: 29 pg (ref 26.0–34.0)
MCHC: 32.8 g/dL (ref 30.0–36.0)
MCV: 88.7 fL (ref 78.0–100.0)
MPV: 10.3 fL (ref 8.6–12.4)
Platelets: 285 10*3/uL (ref 150–400)
RBC: 4.51 MIL/uL (ref 3.87–5.11)
RDW: 13.7 % (ref 11.5–15.5)
WBC: 7.6 10*3/uL (ref 4.0–10.5)

## 2014-07-29 LAB — COMPREHENSIVE METABOLIC PANEL
ALBUMIN: 3.9 g/dL (ref 3.5–5.2)
ALK PHOS: 43 U/L (ref 39–117)
ALT: 10 U/L (ref 0–35)
AST: 13 U/L (ref 0–37)
BILIRUBIN TOTAL: 0.5 mg/dL (ref 0.2–1.2)
BUN: 12 mg/dL (ref 6–23)
CO2: 27 mEq/L (ref 19–32)
Calcium: 9.1 mg/dL (ref 8.4–10.5)
Chloride: 101 mEq/L (ref 96–112)
Creat: 0.79 mg/dL (ref 0.50–1.10)
Glucose, Bld: 84 mg/dL (ref 70–99)
Potassium: 4.8 mEq/L (ref 3.5–5.3)
SODIUM: 139 meq/L (ref 135–145)
Total Protein: 7 g/dL (ref 6.0–8.3)

## 2014-07-29 LAB — LIPID PANEL
Cholesterol: 218 mg/dL — ABNORMAL HIGH (ref 0–200)
HDL: 55 mg/dL (ref >39)
LDL Cholesterol: 135 mg/dL — ABNORMAL HIGH (ref 0–99)
Total CHOL/HDL Ratio: 4 Ratio
Triglycerides: 138 mg/dL (ref <150)
VLDL: 28 mg/dL (ref 0–40)

## 2014-09-23 ENCOUNTER — Encounter (INDEPENDENT_AMBULATORY_CARE_PROVIDER_SITE_OTHER): Payer: BLUE CROSS/BLUE SHIELD | Admitting: Ophthalmology

## 2014-09-23 DIAGNOSIS — H43813 Vitreous degeneration, bilateral: Secondary | ICD-10-CM | POA: Diagnosis not present

## 2014-09-23 DIAGNOSIS — H33302 Unspecified retinal break, left eye: Secondary | ICD-10-CM | POA: Diagnosis not present

## 2014-09-23 DIAGNOSIS — H2512 Age-related nuclear cataract, left eye: Secondary | ICD-10-CM | POA: Diagnosis not present

## 2014-10-05 ENCOUNTER — Other Ambulatory Visit: Payer: Self-pay | Admitting: Obstetrics and Gynecology

## 2014-10-07 ENCOUNTER — Other Ambulatory Visit: Payer: Self-pay | Admitting: *Deleted

## 2014-10-07 MED ORDER — ESTRADIOL 1 MG PO TABS: 1.0000 mg | ORAL_TABLET | Freq: Every day | ORAL | Status: DC

## 2014-10-07 NOTE — Telephone Encounter (Signed)
Rx sent. Patient notified. 

## 2014-10-07 NOTE — Telephone Encounter (Signed)
Medication refill request: Estradiol 1 mg Last AEX:  07/15/14 Dr. Quincy Simmonds Next AEX: 07/28/15 Dr. Quincy Simmonds Last MMG (if hormonal medication request): 07/15/14 BIRADS1:Neg Refill authorized: 03/25/14 #90tabs/1Refill. Today #90tabs Dessie Coma? Please advise.

## 2014-10-14 ENCOUNTER — Ambulatory Visit (INDEPENDENT_AMBULATORY_CARE_PROVIDER_SITE_OTHER): Payer: BLUE CROSS/BLUE SHIELD | Admitting: Ophthalmology

## 2014-10-28 ENCOUNTER — Encounter (INDEPENDENT_AMBULATORY_CARE_PROVIDER_SITE_OTHER): Payer: BLUE CROSS/BLUE SHIELD | Admitting: Ophthalmology

## 2014-10-28 DIAGNOSIS — H33302 Unspecified retinal break, left eye: Secondary | ICD-10-CM

## 2014-11-08 HISTORY — PX: RETINAL TEAR REPAIR CRYOTHERAPY: SHX5304

## 2015-03-10 ENCOUNTER — Ambulatory Visit (INDEPENDENT_AMBULATORY_CARE_PROVIDER_SITE_OTHER): Payer: BLUE CROSS/BLUE SHIELD | Admitting: Ophthalmology

## 2015-03-10 DIAGNOSIS — H33302 Unspecified retinal break, left eye: Secondary | ICD-10-CM | POA: Diagnosis not present

## 2015-03-10 DIAGNOSIS — H43813 Vitreous degeneration, bilateral: Secondary | ICD-10-CM

## 2015-03-10 DIAGNOSIS — H2513 Age-related nuclear cataract, bilateral: Secondary | ICD-10-CM | POA: Diagnosis not present

## 2015-06-21 ENCOUNTER — Other Ambulatory Visit: Payer: Self-pay | Admitting: *Deleted

## 2015-06-21 MED ORDER — ESTRADIOL 1 MG PO TABS: 1.0000 mg | ORAL_TABLET | Freq: Every day | ORAL | Status: DC

## 2015-06-21 NOTE — Telephone Encounter (Signed)
Medication refill request: Estrace 1 mg Last AEX:  07/15/14 Dr. Quincy Simmonds Next AEX: 07/28/15  Last MMG (if hormonal medication request): 07/15/14 BIRADS1:neg Refill authorized: 10/07/14 #90tabs/ 2R. Today #90/0R?

## 2015-06-23 IMAGING — MG STANDARD SCREENING - COMBO
8 series · 9 of 24 positions shown · non-contrast
Comparison: Previous exam(s).

CLINICAL DATA: Screening.

EXAM:
DIGITAL SCREENING BILATERAL MAMMOGRAM WITH CAD
DIGITAL BREAST TOMOSYNTHESIS
Digital breast tomosynthesis images are acquired in two projections.
These images are reviewed in combination with the digital mammogram,
confirming the findings below.

[L CC]
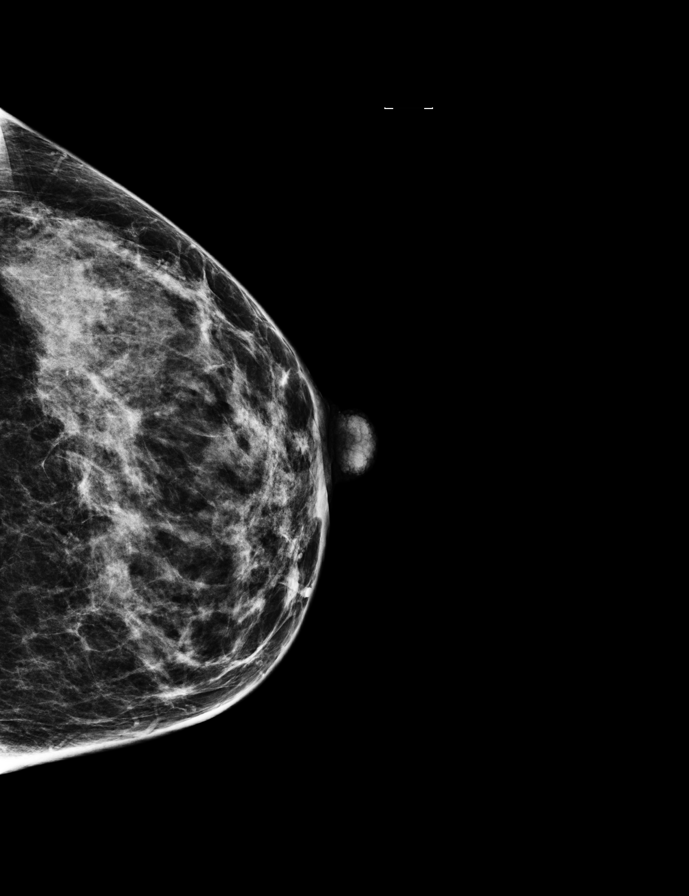

[R MLO]
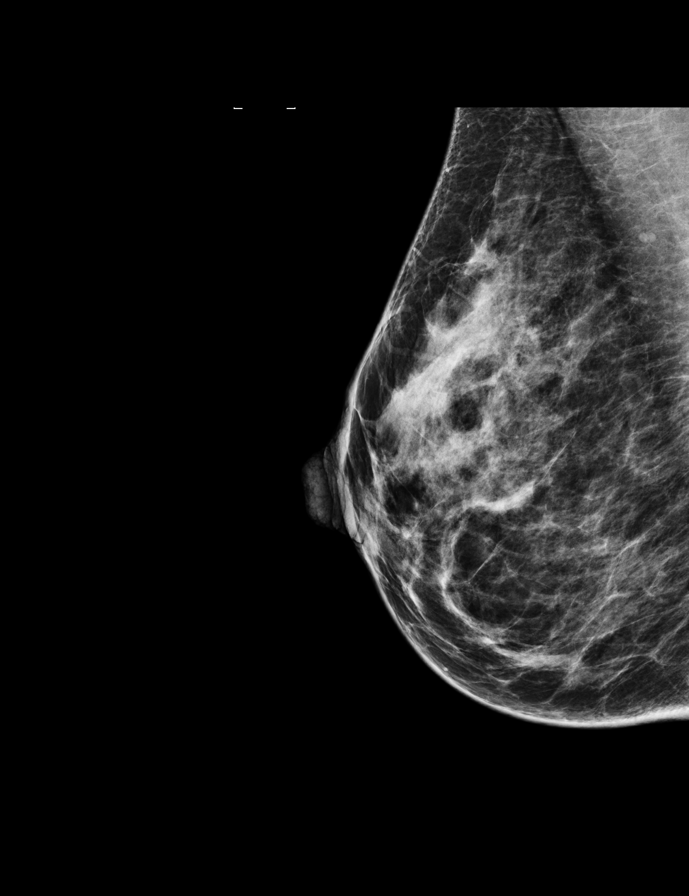

[L MLO]
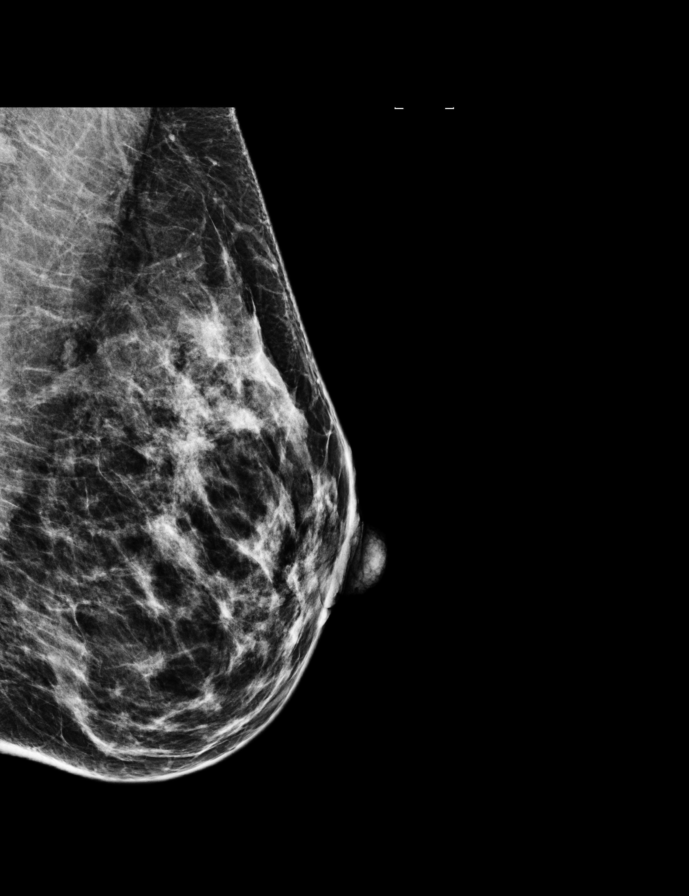

[R CC]
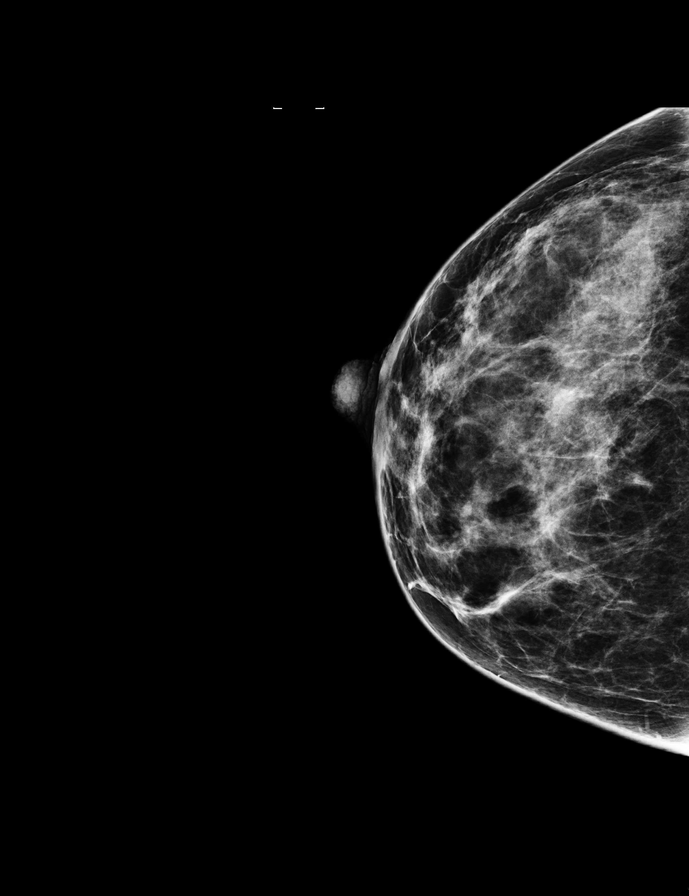

[R MLO tomo · 2 of 60 frames shown]
[frame 20/60]
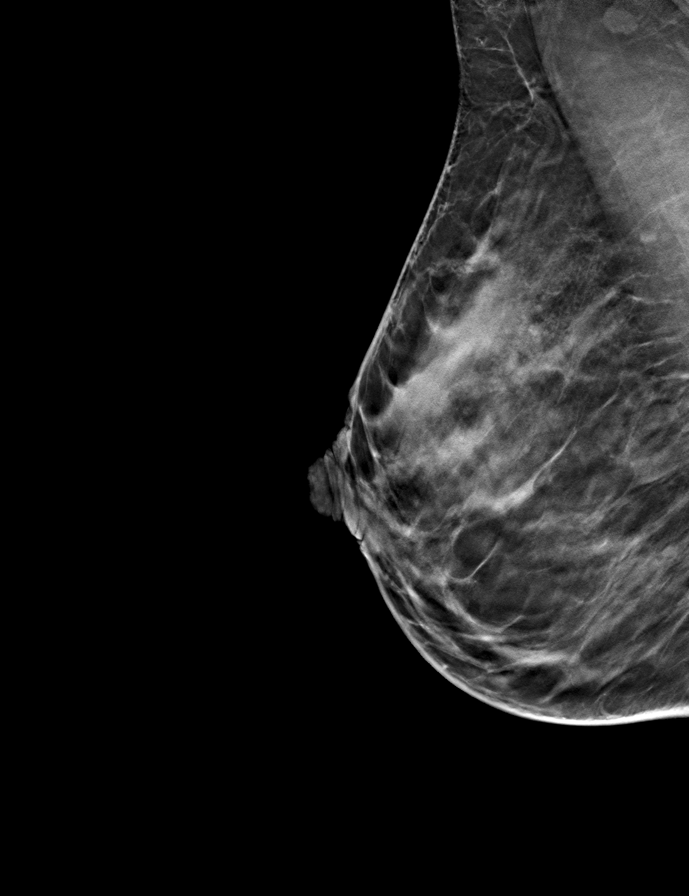
[frame 31/60]
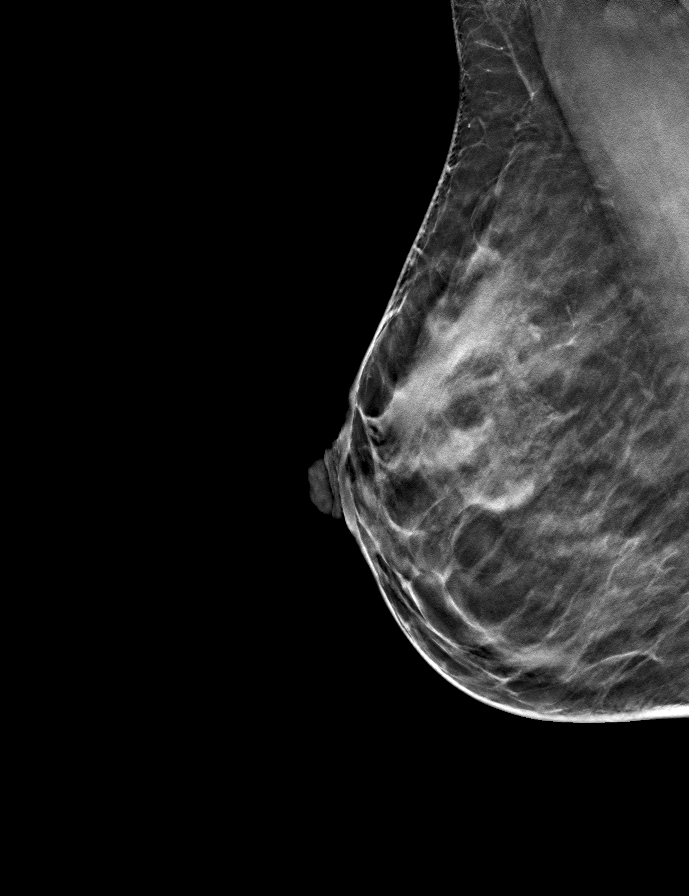

[L CC tomo · tomo slice 29/56.0]
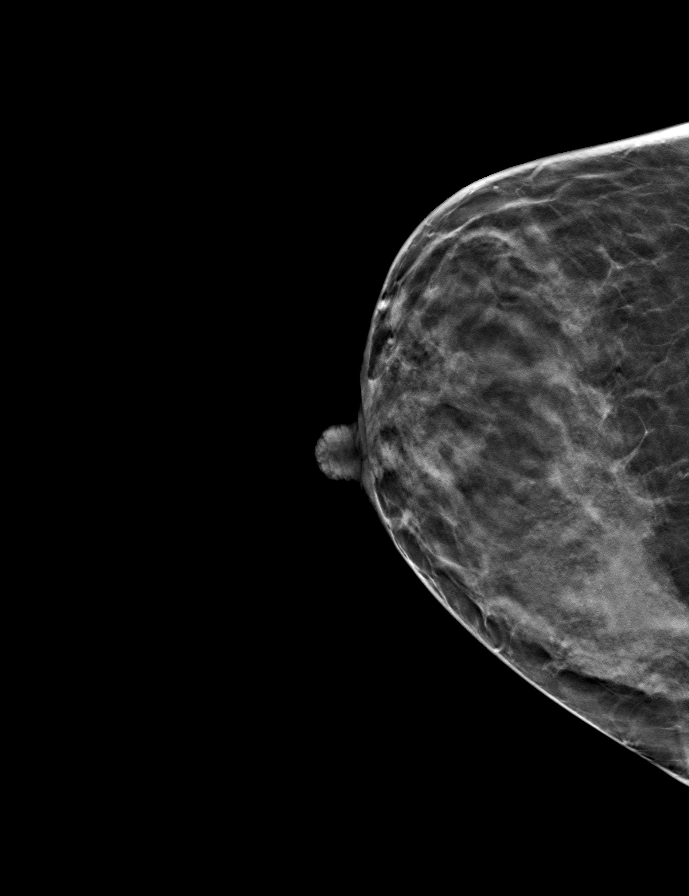

[L MLO tomo · tomo slice 28/55.0]
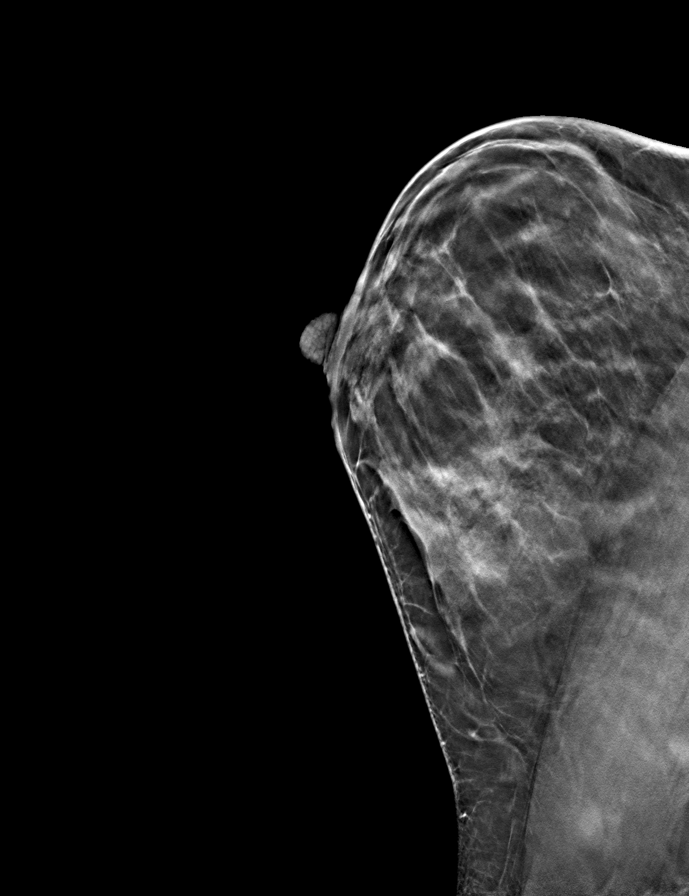

[R CC tomo · tomo slice 29/56.0]
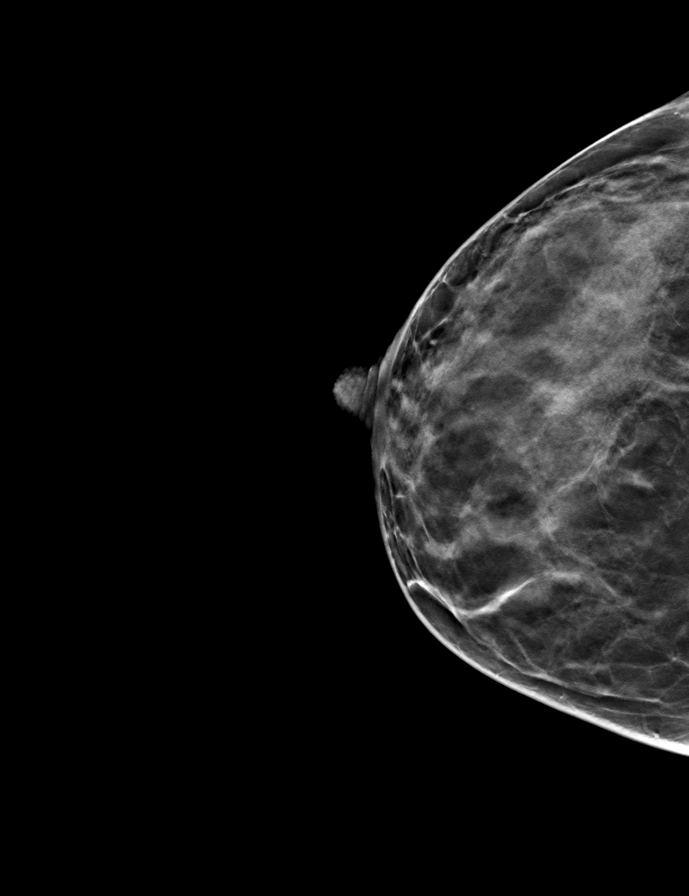

[9 of 24 positions shown; findings below may reference images not displayed]

ACR Breast Density Category c: The breasts are heterogeneously
dense, which may obscure small masses.
FINDINGS: There are no findings suspicious for malignancy. Images were
processed with CAD.
IMPRESSION: No mammographic evidence of malignancy. A result letter of this
screening mammogram will be mailed directly to the patient.

RECOMMENDATION:
Screening mammogram in one year. (Code:R9-A-IFR)

BI-RADS CATEGORY  1: Negative

## 2015-07-14 ENCOUNTER — Other Ambulatory Visit: Payer: Self-pay

## 2015-07-14 DIAGNOSIS — Z1231 Encounter for screening mammogram for malignant neoplasm of breast: Secondary | ICD-10-CM

## 2015-07-28 ENCOUNTER — Ambulatory Visit (INDEPENDENT_AMBULATORY_CARE_PROVIDER_SITE_OTHER): Payer: BLUE CROSS/BLUE SHIELD | Admitting: Obstetrics and Gynecology

## 2015-07-28 ENCOUNTER — Encounter: Payer: Self-pay | Admitting: Obstetrics and Gynecology

## 2015-07-28 VITALS — BP 122/70 | HR 76 | Resp 12 | Ht 66.5 in | Wt 142.2 lb

## 2015-07-28 DIAGNOSIS — R319 Hematuria, unspecified: Secondary | ICD-10-CM

## 2015-07-28 DIAGNOSIS — Z01419 Encounter for gynecological examination (general) (routine) without abnormal findings: Secondary | ICD-10-CM

## 2015-07-28 DIAGNOSIS — Z0000 Encounter for general adult medical examination without abnormal findings: Secondary | ICD-10-CM | POA: Diagnosis not present

## 2015-07-28 LAB — POCT URINALYSIS DIPSTICK
BILIRUBIN UA: NEGATIVE
GLUCOSE UA: NEGATIVE
KETONES UA: NEGATIVE
Nitrite, UA: NEGATIVE
Protein, UA: NEGATIVE
Urobilinogen, UA: NEGATIVE
pH, UA: 5

## 2015-07-28 LAB — CBC
HEMATOCRIT: 40 % (ref 36.0–46.0)
Hemoglobin: 13 g/dL (ref 12.0–15.0)
MCH: 28.8 pg (ref 26.0–34.0)
MCHC: 32.5 g/dL (ref 30.0–36.0)
MCV: 88.7 fL (ref 78.0–100.0)
MPV: 10.6 fL (ref 8.6–12.4)
PLATELETS: 227 10*3/uL (ref 150–400)
RBC: 4.51 MIL/uL (ref 3.87–5.11)
RDW: 13.5 % (ref 11.5–15.5)
WBC: 7.9 10*3/uL (ref 4.0–10.5)

## 2015-07-28 LAB — COMPREHENSIVE METABOLIC PANEL
ALT: 10 U/L (ref 6–29)
AST: 13 U/L (ref 10–35)
Albumin: 3.9 g/dL (ref 3.6–5.1)
Alkaline Phosphatase: 55 U/L (ref 33–115)
BILIRUBIN TOTAL: 0.4 mg/dL (ref 0.2–1.2)
BUN: 16 mg/dL (ref 7–25)
CALCIUM: 9.2 mg/dL (ref 8.6–10.2)
CO2: 25 mmol/L (ref 20–31)
Chloride: 104 mmol/L (ref 98–110)
Creat: 0.84 mg/dL (ref 0.50–1.10)
GLUCOSE: 55 mg/dL — AB (ref 65–99)
Potassium: 4.5 mmol/L (ref 3.5–5.3)
SODIUM: 140 mmol/L (ref 135–146)
Total Protein: 6.6 g/dL (ref 6.1–8.1)

## 2015-07-28 LAB — HEMOGLOBIN, FINGERSTICK: Hemoglobin, fingerstick: 12.6 g/dL (ref 12.0–16.0)

## 2015-07-28 LAB — LIPID PANEL
CHOL/HDL RATIO: 4.2 ratio (ref <=5.0)
Cholesterol: 224 mg/dL — ABNORMAL HIGH (ref 125–200)
HDL: 53 mg/dL (ref >=46)
LDL CALC: 145 mg/dL — AB (ref <130)
Triglycerides: 128 mg/dL (ref <150)
VLDL: 26 mg/dL (ref <30)

## 2015-07-28 NOTE — Patient Instructions (Signed)

## 2015-07-28 NOTE — Progress Notes (Signed)
Patient ID: Kristin Greene, female   DOB: January 01, 1966, 50 y.o.   MRN: [id number] 50 y.o. G33P2002 Married Caucasian female here for annual exam.    Has Mirena IUD.  Also taking Estrace daily.  Takes 1/2 tablet.  Thinks she is gaining weight.  Would like to stop Estrace.  Started for increased temperature at hs.   Sees Dr. Caprice Beaver rarely now.  On Zoloft for generalized anxiety.  Rarely uses Xanax now.  Has joy again when she goes to work.   Went skiing in [place] with her boys.  Going to [place] for her 50th birthday.   PCP:  Shon Baton, MD   No LMP recorded. Patient is not currently having periods (Reason: IUD).          Sexually active: Yes.  female  The current method of family planning is IUD--Mirena inserted 06-11-13.    Exercising: Yes.    weights and walking. Smoker:  no  Health Maintenance: Pap:  04/2012 Neg:Neg HR HPV History of abnormal Pap:  no MMG:  07-15-14 3D/Density Cat.C/Neg/BiRads1:[facility] Colonoscopy:  n/a BMD:   n/a  Result  n/a TDaP:  03/2010 Screening Labs:  Hb today: 12.6, Urine today: Tr.WBCs, 1+RBCs--asymptomatic.     reports that she has never smoked. She has never used smokeless tobacco. She reports that she drinks alcohol. She reports that she does not use illicit drugs.  Past Medical History  Diagnosis Date  . Endometriosis   . Anxiety     panic    Past Surgical History  Procedure Laterality Date  . Pelvic laparoscopy  03-01-10    LSO secondary torsion, endometriosis--Dr. Caren Griffins Romine  . Abdomnioplasty  06/2003  . Retinal tear repair cryotherapy Left 11/2014    Current Outpatient Prescriptions  Medication Sig Dispense Refill  . ALPRAZolam (XANAX) 0.25 MG tablet Take 1 tablet (0.25 mg total) by mouth 3 (three) times daily as needed for anxiety. 30 tablet 0  . estradiol (ESTRACE) 1 MG tablet Take 1 tablet (1 mg total) by mouth daily. 90 tablet 0  . levonorgestrel (MIRENA) 20 MCG/24HR IUD 1 each by Intrauterine route once.    .  sertraline (ZOLOFT) 50 MG tablet Take 1 tablet by mouth daily.  0   No current facility-administered medications for this visit.    Family History  Problem Relation Age of Onset  . Hypertension Father   . Heart failure Maternal Grandmother     CHF  . Heart failure Paternal Grandmother     CHF  . Rheum arthritis Paternal Grandmother   . Hyperlipidemia Mother   . Osteoporosis Mother     ROS:  Pertinent items are noted in HPI.  Otherwise, a comprehensive ROS was negative.  Exam:   BP 122/70 mmHg  Pulse 76  Resp 12  Ht 5' 6.5" (1.689 m)  Wt 142 lb 3.2 oz (64.501 kg)  BMI 22.61 kg/m2    General appearance: alert, cooperative and appears stated age Head: Normocephalic, without obvious abnormality, atraumatic Neck: no adenopathy, supple, symmetrical, trachea midline and thyroid normal to inspection and palpation Lungs: clear to auscultation bilaterally Breasts: normal appearance, no masses or tenderness, Inspection negative, No nipple retraction or dimpling, No nipple discharge or bleeding, No axillary or supraclavicular adenopathy Heart: regular rate and rhythm Abdomen: abdominoplasty scars, soft, non-tender; bowel sounds normal; no masses,  no organomegaly Extremities: extremities normal, atraumatic, no cyanosis or edema Skin: Skin color, texture, turgor normal. No rashes or lesions Lymph nodes: Cervical, supraclavicular, and axillary nodes  normal. No abnormal inguinal nodes palpated Neurologic: Grossly normal  Pelvic: External genitalia:  no lesions              Urethra:  normal appearing urethra with no masses, tenderness or lesions              Bartholins and Skenes: normal                 Vagina: normal appearing vagina with normal color and discharge, no lesions              Cervix: IUD strings seen.  Small 4 mm polyp friable with pap.               Pap taken: Yes.   Bimanual Exam:  Uterus:  normal size, contour, position, consistency, mobility, non-tender.  Some  difficulty relaxing with abdominal and bimanual exam.               Adnexa: normal adnexa and no mass, fullness, tenderness              Rectovaginal: Yes.  .  Confirms.              Anus:  normal sphincter tone, no lesions  Chaperone was present for exam.  Assessment:   Well woman visit with normal exam. Mirena IUD.  Small cervical polyp.  Does not need removal at this time.  Estrace therapy for hot flashes.  Generalized anxiety controlled on Zoloft and rare Xanax.  Abnormal urine. Microscopic hematuria.   Plan: Yearly mammogram recommended after age 17.  Recommended self breast exam.  Pap and HR HPV as above. Discussed Calcium, Vitamin D, regular exercise program including cardiovascular and weight bearing exercise. Labs performed.  Yes.  .   See orders.  General labs.  Urine micro and culture.  Refills given on medications.  No..  OK to stop Estrace and see if hot flashes or night sweats return.  Can then go back on a lower dosage of estrogen.  Follow up annually and prn.      After visit summary provided.

## 2015-07-29 LAB — URINE CULTURE
COLONY COUNT: NO GROWTH
Organism ID, Bacteria: NO GROWTH

## 2015-07-29 LAB — URINALYSIS, MICROSCOPIC ONLY
BACTERIA UA: NONE SEEN [HPF]
CASTS: NONE SEEN [LPF]
Crystals: NONE SEEN [HPF]
YEAST: NONE SEEN [HPF]

## 2015-07-29 LAB — VITAMIN D 25 HYDROXY (VIT D DEFICIENCY, FRACTURES): Vit D, 25-Hydroxy: 36 ng/mL (ref 30–100)

## 2015-07-30 ENCOUNTER — Encounter: Payer: Self-pay | Admitting: Obstetrics and Gynecology

## 2015-07-30 LAB — IPS PAP TEST WITH HPV

## 2015-09-29 ENCOUNTER — Ambulatory Visit
Admission: RE | Admit: 2015-09-29 | Discharge: 2015-09-29 | Disposition: A | Discharge status: Home / Self Care | Payer: BLUE CROSS/BLUE SHIELD | Source: Ambulatory Visit

## 2015-09-29 DIAGNOSIS — Z1231 Encounter for screening mammogram for malignant neoplasm of breast: Secondary | ICD-10-CM

## 2015-11-22 DIAGNOSIS — Z0289 Encounter for other administrative examinations: Secondary | ICD-10-CM

## 2015-11-24 DIAGNOSIS — Z0289 Encounter for other administrative examinations: Secondary | ICD-10-CM

## 2015-12-02 DIAGNOSIS — Z0289 Encounter for other administrative examinations: Secondary | ICD-10-CM

## 2016-01-19 DIAGNOSIS — F411 Generalized anxiety disorder: Secondary | ICD-10-CM | POA: Diagnosis not present

## 2016-03-22 DIAGNOSIS — D225 Melanocytic nevi of trunk: Secondary | ICD-10-CM | POA: Diagnosis not present

## 2016-03-22 DIAGNOSIS — D2271 Melanocytic nevi of right lower limb, including hip: Secondary | ICD-10-CM | POA: Diagnosis not present

## 2016-03-22 DIAGNOSIS — D224 Melanocytic nevi of scalp and neck: Secondary | ICD-10-CM | POA: Diagnosis not present

## 2016-03-22 DIAGNOSIS — D2272 Melanocytic nevi of left lower limb, including hip: Secondary | ICD-10-CM | POA: Diagnosis not present

## 2016-08-09 DIAGNOSIS — H31002 Unspecified chorioretinal scars, left eye: Secondary | ICD-10-CM | POA: Diagnosis not present

## 2016-08-09 DIAGNOSIS — H2512 Age-related nuclear cataract, left eye: Secondary | ICD-10-CM | POA: Diagnosis not present

## 2016-08-09 DIAGNOSIS — H25013 Cortical age-related cataract, bilateral: Secondary | ICD-10-CM | POA: Diagnosis not present

## 2016-08-09 DIAGNOSIS — H5213 Myopia, bilateral: Secondary | ICD-10-CM | POA: Diagnosis not present

## 2016-08-14 NOTE — Progress Notes (Signed)
51 y.o. G63P2002 Married Caucasian female here for annual exam.    Work going well. 2 children in high school.  Has Mirena IUD. Not really having hot flashes.  Not taking Estrace.   Feels like is passing air from the vagina.  Can leak urine with hands in water and sometimes with exercise.   1 caffeine per day.   Has elevated cholesterol.   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  On Zoloft 50 mg daily.  Working well.  Would like for me to take this Rx over.  Mother has osteoporosis.   PCP:  [facility]  No LMP recorded. Patient is not currently having periods (Reason: IUD).           Sexually active: Yes.    The current method of family planning is IUD--inserted 06-11-13.    Exercising: Yes.    Weights, cardio Smoker:  no  Health Maintenance: Pap: 07-28-15 Neg:Neg HR HPV; 05-01-12 Neg:Neg HR HPV History of abnormal Pap:  no MMG:  09-29-15 Density C/Neg/BiRads1:TBC.  She will schedule. Colonoscopy:  N/a.  She will schedule. BMD:   n/a  Result  n/a TDaP:  03/2010 Gardasil:   N/A HIV: a long time ago Hep C: Never Urine today: 1+ WBC 5.0 pH   reports that she has never smoked. She has never used smokeless tobacco. She reports that she drinks alcohol. She reports that she does not use drugs.  Past Medical History:  Diagnosis Date  . Anxiety    panic  . Endometriosis   . Microscopic hematuria    January 2017     Past Surgical History:  Procedure Laterality Date  . abdomnioplasty  06/2003  . PELVIC LAPAROSCOPY  03-01-10   LSO secondary torsion, endometriosis--Dr. Caren Griffins Romine  . RETINAL TEAR REPAIR CRYOTHERAPY Left 11/2014    Current Outpatient Prescriptions  Medication Sig Dispense Refill  . ALPRAZolam (XANAX) 0.25 MG tablet Take 1 tablet (0.25 mg total) by mouth 3 (three) times daily as needed for anxiety. 30 tablet 0  . estradiol (ESTRACE) 1 MG tablet Take 1 tablet (1 mg total) by mouth daily. 90 tablet 0  .  levonorgestrel (MIRENA) 20 MCG/24HR IUD 1 each by Intrauterine route once.    . sertraline (ZOLOFT) 50 MG tablet Take 1 tablet by mouth daily.  0   No current facility-administered medications for this visit.     Family History  Problem Relation Age of Onset  . Hypertension Father   . Heart failure Maternal Grandmother     CHF  . Heart failure Paternal Grandmother     CHF  . Rheum arthritis Paternal Grandmother   . Hyperlipidemia Mother   . Osteoporosis Mother     ROS:  Pertinent items are noted in HPI.  Otherwise, a comprehensive ROS was negative.  Exam:   BP 120/80 (BP Location: Right Arm, Patient Position: Sitting, Cuff Size: Normal)   Pulse 72   Resp 14   Ht 5\' 6"  (1.676 m)   Wt 148 lb 9.6 oz (67.4 kg)   BMI 23.98 kg/m     General appearance: alert, cooperative and appears stated age Head: Normocephalic, without obvious abnormality, atraumatic Neck: no adenopathy, supple, symmetrical, trachea midline and thyroid normal to inspection and palpation Lungs: clear to auscultation bilaterally Breasts: normal appearance, no masses or tenderness, No nipple retraction or dimpling, No nipple discharge or bleeding, No axillary or supraclavicular adenopathy Heart: regular rate and rhythm Abdomen: soft, non-tender; no masses, no  organomegaly Extremities: extremities normal, atraumatic, no cyanosis or edema Skin: Skin color, texture, turgor normal. No rashes or lesions Lymph nodes: Cervical, supraclavicular, and axillary nodes normal. No abnormal inguinal nodes palpated Neurologic: Grossly normal  Pelvic: External genitalia:  no lesions              Urethra:  normal appearing urethra with no masses, tenderness or lesions              Bartholins and Skenes: normal                 Vagina: normal appearing vagina with normal color and discharge, no lesions              Cervix: no lesions.  IUD strings seen.  Small polyp just barely visible at os.               Pap taken:  No. Bimanual Exam:  Uterus:  normal size, contour, position, consistency, mobility, non-tender              Adnexa: no mass, fullness, tenderness              Rectal exam: Yes.  .  Confirms.              Anus:  normal sphincter tone, no lesions  Chaperone was present for exam.  Assessment:   Well woman visit with normal exam. Mirena IUD patient.  Urinary urgency and incontinence.  Anxiety.   Plan: Mammogram screening discussed. Recommended self breast awareness. Pap and HR HPV as above. Guidelines for Calcium, Vitamin D, regular exercise program including cardiovascular and weight bearing exercise. Routine labs.  Urine micro and culture sent after visit when I saw her urine dip result. I will start prescribing Zoloft 50 mg daily.  Rx given for one year.  Follow up annually and prn.       After visit summary provided.

## 2016-08-16 ENCOUNTER — Ambulatory Visit (INDEPENDENT_AMBULATORY_CARE_PROVIDER_SITE_OTHER): Payer: BLUE CROSS/BLUE SHIELD | Admitting: Obstetrics and Gynecology

## 2016-08-16 ENCOUNTER — Encounter: Payer: Self-pay | Admitting: Obstetrics and Gynecology

## 2016-08-16 VITALS — BP 120/80 | HR 72 | Resp 14 | Ht 66.0 in | Wt 148.6 lb

## 2016-08-16 DIAGNOSIS — R8290 Unspecified abnormal findings in urine: Secondary | ICD-10-CM

## 2016-08-16 DIAGNOSIS — Z0000 Encounter for general adult medical examination without abnormal findings: Secondary | ICD-10-CM

## 2016-08-16 DIAGNOSIS — Z01419 Encounter for gynecological examination (general) (routine) without abnormal findings: Secondary | ICD-10-CM | POA: Diagnosis not present

## 2016-08-16 DIAGNOSIS — R3915 Urgency of urination: Secondary | ICD-10-CM | POA: Diagnosis not present

## 2016-08-16 LAB — CBC
HCT: 41.7 % (ref 35.0–45.0)
HEMOGLOBIN: 13.4 g/dL (ref 11.7–15.5)
MCH: 28.5 pg (ref 27.0–33.0)
MCHC: 32.1 g/dL (ref 32.0–36.0)
MCV: 88.7 fL (ref 80.0–100.0)
MPV: 10.5 fL (ref 7.5–12.5)
PLATELETS: 256 10*3/uL (ref 140–400)
RBC: 4.7 MIL/uL (ref 3.80–5.10)
RDW: 14 % (ref 11.0–15.0)
WBC: 7.1 10*3/uL (ref 3.8–10.8)

## 2016-08-16 LAB — COMPREHENSIVE METABOLIC PANEL
ALK PHOS: 54 U/L (ref 33–130)
ALT: 12 U/L (ref 6–29)
AST: 17 U/L (ref 10–35)
Albumin: 4.3 g/dL (ref 3.6–5.1)
BUN: 13 mg/dL (ref 7–25)
CALCIUM: 9.5 mg/dL (ref 8.6–10.4)
CHLORIDE: 105 mmol/L (ref 98–110)
CO2: 26 mmol/L (ref 20–31)
Creat: 0.9 mg/dL (ref 0.50–1.05)
GLUCOSE: 85 mg/dL (ref 65–99)
POTASSIUM: 4.6 mmol/L (ref 3.5–5.3)
Sodium: 140 mmol/L (ref 135–146)
Total Bilirubin: 0.3 mg/dL (ref 0.2–1.2)
Total Protein: 7 g/dL (ref 6.1–8.1)

## 2016-08-16 LAB — LIPID PANEL
CHOLESTEROL: 248 mg/dL — AB (ref <200)
HDL: 51 mg/dL (ref >50)
LDL Cholesterol: 166 mg/dL — ABNORMAL HIGH (ref <100)
Total CHOL/HDL Ratio: 4.9 Ratio (ref <5.0)
Triglycerides: 157 mg/dL — ABNORMAL HIGH (ref <150)
VLDL: 31 mg/dL — ABNORMAL HIGH (ref <30)

## 2016-08-16 LAB — POCT URINALYSIS DIPSTICK
Bilirubin, UA: NEGATIVE
Glucose, UA: NEGATIVE
Ketones, UA: NEGATIVE
Nitrite, UA: NEGATIVE
PROTEIN UA: NEGATIVE
RBC UA: NEGATIVE
UROBILINOGEN UA: NEGATIVE
pH, UA: 5

## 2016-08-16 LAB — TSH: TSH: 1.63 mIU/L

## 2016-08-16 MED ORDER — SERTRALINE HCL 50 MG PO TABS: 50.0000 mg | ORAL_TABLET | Freq: Every day | ORAL | 3 refills | Status: DC

## 2016-08-16 NOTE — Patient Instructions (Signed)

## 2016-08-17 ENCOUNTER — Encounter: Payer: Self-pay | Admitting: Obstetrics and Gynecology

## 2016-08-17 LAB — URINALYSIS, MICROSCOPIC ONLY
Bacteria, UA: NONE SEEN [HPF]
CASTS: NONE SEEN [LPF]
Crystals: NONE SEEN [HPF]
Squamous Epithelial / LPF: NONE SEEN [HPF] (ref <=5)
Yeast: NONE SEEN [HPF]

## 2016-08-17 LAB — URINE CULTURE: ORGANISM ID, BACTERIA: NO GROWTH

## 2016-08-17 LAB — VITAMIN D 25 HYDROXY (VIT D DEFICIENCY, FRACTURES): VIT D 25 HYDROXY: 31 ng/mL (ref 30–100)

## 2016-08-23 ENCOUNTER — Encounter: Payer: Self-pay | Admitting: Internal Medicine

## 2016-08-24 ENCOUNTER — Other Ambulatory Visit: Payer: Self-pay

## 2016-08-24 DIAGNOSIS — K625 Hemorrhage of anus and rectum: Secondary | ICD-10-CM

## 2016-10-11 ENCOUNTER — Encounter: Payer: Self-pay | Admitting: Internal Medicine

## 2016-10-11 ENCOUNTER — Ambulatory Visit: Payer: BLUE CROSS/BLUE SHIELD | Admitting: *Deleted

## 2016-10-11 VITALS — Ht 66.0 in | Wt 150.6 lb

## 2016-10-11 DIAGNOSIS — Z1211 Encounter for screening for malignant neoplasm of colon: Secondary | ICD-10-CM

## 2016-10-11 MED ORDER — NA SULFATE-K SULFATE-MG SULF 17.5-3.13-1.6 GM/177ML PO SOLN: 1.0000 | Freq: Once | ORAL | 0 refills | Status: AC

## 2016-10-11 NOTE — Progress Notes (Signed)
Denies allergies to eggs or soy products. Denies complications with sedation or anesthesia. Denies O2 use. Denies use of diet or weight loss medications.  Emmi instructions given for colonoscopy.  

## 2016-10-17 ENCOUNTER — Encounter: Payer: Self-pay | Admitting: Internal Medicine

## 2016-10-25 ENCOUNTER — Encounter: Payer: Self-pay | Admitting: Internal Medicine

## 2016-10-25 ENCOUNTER — Ambulatory Visit (AMBULATORY_SURGERY_CENTER): Payer: BLUE CROSS/BLUE SHIELD | Admitting: Internal Medicine

## 2016-10-25 VITALS — BP 142/72 | HR 65 | Temp 98.2°F | Resp 13 | Ht 66.0 in | Wt 150.0 lb

## 2016-10-25 DIAGNOSIS — Z1212 Encounter for screening for malignant neoplasm of rectum: Secondary | ICD-10-CM

## 2016-10-25 DIAGNOSIS — K635 Polyp of colon: Secondary | ICD-10-CM

## 2016-10-25 DIAGNOSIS — Z1211 Encounter for screening for malignant neoplasm of colon: Secondary | ICD-10-CM | POA: Diagnosis present

## 2016-10-25 DIAGNOSIS — D125 Benign neoplasm of sigmoid colon: Secondary | ICD-10-CM

## 2016-10-25 MED ORDER — SODIUM CHLORIDE 0.9 % IV SOLN: 500.0000 mL | INTRAVENOUS | Status: AC

## 2016-10-25 NOTE — Op Note (Signed)
[place] Patient Name: Kristin Greene Procedure Date: 10/25/2016 7:51 AM MRN: [id number] Endoscopist: Jerene Bears , MD Age: 51 Referring MD:  Date of Birth: 15-May-1966 Gender: Female Account #: [id number] Procedure:                Colonoscopy Indications:              Screening for colorectal malignant neoplasm, This                            is the patient's first colonoscopy Medicines:                Monitored Anesthesia Care Procedure:                Pre-Anesthesia Assessment:                           - Prior to the procedure, a History and Physical                            was performed, and patient medications and                            allergies were reviewed. The patient's tolerance of                            previous anesthesia was also reviewed. The risks                            and benefits of the procedure and the sedation                            options and risks were discussed with the patient.                            All questions were answered, and informed consent                            was obtained. Prior Anticoagulants: The patient has                            taken no previous anticoagulant or antiplatelet                            agents. ASA Grade Assessment: II - A patient with                            mild systemic disease. After reviewing the risks                            and benefits, the patient was deemed in                            satisfactory condition to undergo the procedure.  After obtaining informed consent, the colonoscope                            was passed under direct vision. Throughout the                            procedure, the patient's blood pressure, pulse, and                            oxygen saturations were monitored continuously. The                            Colonoscope was introduced through the anus and                            advanced to the the  cecum, identified by                            appendiceal orifice and ileocecal valve. The                            patient tolerated the procedure well. The quality                            of the bowel preparation was good. The terminal                            ileum, ileocecal valve, appendiceal orifice, and                            rectum were photographed. The colonoscopy was                            somewhat difficult due to a tortuous colon.                            Successful completion of the procedure was aided by                            applying abdominal pressure. Scope In: 8:14:04 AM Scope Out: 8:39:25 AM Scope Withdrawal Time: 0 hours 12 minutes 3 seconds  Total Procedure Duration: 0 hours 25 minutes 21 seconds  Findings:                 The digital rectal exam was normal.                           A 3 mm polyp was found in the sigmoid colon. The                            polyp was pedunculated. The polyp was removed with                            a cold biopsy forceps. Resection and retrieval  were                            complete.                           Multiple medium-mouthed diverticula were found in                            the sigmoid colon and ascending colon.                           The exam was otherwise without abnormality on                            direct and retroflexion views. Complications:            No immediate complications. Estimated Blood Loss:     Estimated blood loss was minimal. Impression:               - One 3 mm polyp in the sigmoid colon, removed with                            a cold biopsy forceps. Resected and retrieved.                           - Mild diverticulosis in the sigmoid colon and in                            the ascending colon.                           - The examination was otherwise normal on direct                            and retroflexion views. Recommendation:           - Patient has a  contact number available for                            emergencies. The signs and symptoms of potential                            delayed complications were discussed with the                            patient. Return to normal activities tomorrow.                            Written discharge instructions were provided to the                            patient.                           - Resume previous diet.                           -  Continue present medications.                           - Await pathology results.                           - Repeat colonoscopy is recommended. The                            colonoscopy date will be determined after pathology                            results from today's exam become available for                            review. Jerene Bears, MD 10/25/2016 8:43:47 AM This report has been signed electronically.

## 2016-10-25 NOTE — Patient Instructions (Signed)
Handouts given on polyps and diverticulosis  YOU HAD AN ENDOSCOPIC PROCEDURE TODAY: Refer to the procedure report and other information in the discharge instructions given to you for any specific questions about what was found during the examination. If this information does not answer your questions, please call [place] office at [phone] to clarify.   YOU SHOULD EXPECT: Some feelings of bloating in the abdomen. Passage of more gas than usual. Walking can help get rid of the air that was put into your GI tract during the procedure and reduce the bloating. If you had a lower endoscopy (such as a colonoscopy or flexible sigmoidoscopy) you may notice spotting of blood in your stool or on the toilet paper. Some abdominal soreness may be present for a day or two, also.  DIET: Your first meal following the procedure should be a light meal and then it is ok to progress to your normal diet. A half-sandwich or bowl of soup is an example of a good first meal. Heavy or fried foods are harder to digest and may make you feel nauseous or bloated. Drink plenty of fluids but you should avoid alcoholic beverages for 24 hours. If you had a esophageal dilation, please see attached instructions for diet.    ACTIVITY: Your care partner should take you home directly after the procedure. You should plan to take it easy, moving slowly for the rest of the day. You can resume normal activity the day after the procedure however YOU SHOULD NOT DRIVE, use power tools, machinery or perform tasks that involve climbing or major physical exertion for 24 hours (because of the sedation medicines used during the test).   SYMPTOMS TO REPORT IMMEDIATELY: A gastroenterologist can be reached at any hour. Please call [phone]  for any of the following symptoms:  Following lower endoscopy (colonoscopy, flexible sigmoidoscopy) Excessive amounts of blood in the stool  Significant tenderness, worsening of abdominal pains  Swelling of  the abdomen that is new, acute  Fever of 100 or higher    FOLLOW UP:  If any biopsies were taken you will be contacted by phone or by letter within the next 1-3 weeks. Call [phone]  if you have not heard about the biopsies in 3 weeks.  Please also call with any specific questions about appointments or follow up tests.  

## 2016-10-25 NOTE — Progress Notes (Signed)
Called to room to assist during endoscopic procedure.  Patient ID and intended procedure confirmed with present staff. Received instructions for my participation in the procedure from the performing physician.  

## 2016-10-25 NOTE — Progress Notes (Signed)
No changes in medical or surgical hx since PV 

## 2016-10-25 NOTE — Progress Notes (Signed)
To recovery, report to RN, VSS. 

## 2016-10-26 ENCOUNTER — Telehealth: Payer: Self-pay

## 2016-10-26 NOTE — Telephone Encounter (Signed)
  Follow up Call-  Call back number 10/25/2016  Post procedure Call Back phone  # [phone]  Permission to leave phone message Yes  Some recent data might be hidden     Patient questions:  Do you have a fever, pain , or abdominal swelling? No. Pain Score  0 *  Have you tolerated food without any problems? Yes.    Have you been able to return to your normal activities? Yes.    Do you have any questions about your discharge instructions: Diet   No. Medications  No. Follow up visit  No.  Do you have questions or concerns about your Care? No.  Actions: * If pain score is 4 or above: No action needed, pain <4.

## 2016-10-31 ENCOUNTER — Encounter: Payer: Self-pay | Admitting: Internal Medicine

## 2017-02-05 ENCOUNTER — Other Ambulatory Visit: Payer: Self-pay | Admitting: Obstetrics and Gynecology

## 2017-02-05 DIAGNOSIS — Z1231 Encounter for screening mammogram for malignant neoplasm of breast: Secondary | ICD-10-CM

## 2017-04-07 ENCOUNTER — Ambulatory Visit (INDEPENDENT_AMBULATORY_CARE_PROVIDER_SITE_OTHER): Payer: Self-pay | Admitting: Family

## 2017-04-07 VITALS — BP 124/86 | HR 78 | Temp 98.4°F | Resp 18 | Wt 148.9 lb

## 2017-04-07 DIAGNOSIS — T7840XA Allergy, unspecified, initial encounter: Secondary | ICD-10-CM | POA: Insufficient documentation

## 2017-04-07 MED ORDER — PREDNISONE 5 MG PO TABS: 5.0000 mg | ORAL_TABLET | ORAL | 0 refills | Status: DC

## 2017-04-07 MED ORDER — HYDROXYZINE PAMOATE 25 MG PO CAPS: 25.0000 mg | ORAL_CAPSULE | Freq: Four times a day (QID) | ORAL | 0 refills | Status: DC | PRN

## 2017-04-07 NOTE — Progress Notes (Signed)
Kristin Greene  Chief Complaint  Patient presents with  . Rash      ICD-10-CM   1. Allergic reaction, initial encounter T78.40XA predniSONE (DELTASONE) 5 MG tablet    hydrOXYzine (VISTARIL) 25 MG capsule    Patient Active Problem List   Diagnosis Date Noted  . Allergic reaction 04/07/2017    Past Medical History:  Diagnosis Date  . Anxiety    panic  . Elevated LDL cholesterol level   . Endometriosis   . Microscopic hematuria    January 2017     Past Surgical History:  Procedure Laterality Date  . abdomnioplasty  06/2003  . LAPAROSCOPIC OOPHERECTOMY  2011  . PELVIC LAPAROSCOPY  03-01-10   LSO secondary torsion, endometriosis--Dr. Caren Griffins Romine  . RETINAL TEAR REPAIR CRYOTHERAPY Left 11/2014    Allergies  Allergen Reactions  . Sulfa Antibiotics   . Penicillins Rash    BP 124/86 (BP Location: Right Arm, Patient Position: Sitting, Cuff Size: Normal)   Pulse 78   Temp 98.4 F (36.9 C) (Oral)   Resp 18   Wt 148 lb 14.4 oz (67.5 kg)   SpO2 100%   BMI 24.03 kg/m   Review of Systems  Constitutional: Negative.   HENT: Negative.   Eyes: Negative.   Respiratory: Negative.   Cardiovascular: Negative.   Gastrointestinal: Negative.   Genitourinary: Negative.   Musculoskeletal: Negative.   Skin: Positive for itching and rash.  Neurological: Negative.   Endo/Heme/Allergies: Negative.   Psychiatric/Behavioral: Negative.   All other systems reviewed and are negative.  Physical Exam  Constitutional: She is oriented to person, place, and time. She appears well-developed and well-nourished.  HENT:  Head: Normocephalic and atraumatic.  Eyes: Conjunctivae are normal.  Neck: Normal range of motion. Neck supple.  Cardiovascular: Normal rate, regular rhythm and normal heart sounds.   Pulmonary/Chest: Effort normal and breath sounds normal.  Abdominal: Soft. Bowel sounds are normal.  Musculoskeletal: Normal range of motion.  Neurological: She is alert and oriented  to person, place, and time.  Skin: Skin is warm and dry. Rash noted.  Diffuse erythematous rash on trunk/abdomen/flank and bilateral arms  Vitals reviewed.   No results found for this or any previous visit (from the past 72 hour(s)).   Current Outpatient Prescriptions:  .  ALPRAZolam (XANAX) 0.25 MG tablet, Take 1 tablet (0.25 mg total) by mouth 3 (three) times daily as needed for anxiety., Disp: 30 tablet, Rfl: 0 .  levonorgestrel (MIRENA) 20 MCG/24HR IUD, 1 each by Intrauterine route once., Disp: , Rfl:  .  sertraline (ZOLOFT) 50 MG tablet, Take 1 tablet (50 mg total) by mouth daily., Disp: 90 tablet, Rfl: 3 .  hydrOXYzine (VISTARIL) 25 MG capsule, Take 1-2 capsules (25-50 mg total) by mouth every 6 (six) hours as needed for itching., Disp: 42 capsule, Rfl: 0 .  predniSONE (DELTASONE) 5 MG tablet, Take 1 tablet (5 mg total) by mouth as directed., Disp: 21 tablet, Rfl: 0  Current Facility-Administered Medications:  .  0.9 %  sodium chloride infusion, 500 mL, Intravenous, Continuous, Pyrtle, Lajuan Lines, MD  Subjective: "I've had a rash since Wednesday and I've been taking benadryl 50mg  about 3-4 times per day and it's not really improving. 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 Pt is alert/oriented x3, calm, cooperative, and in NAD. Pt reports diffuse erythematous rash of unknown origin for approximately 3 days including bilateral flank regions, bilateral arms, neck,  and abdomen area. Pt denies any change in soaps, shampoos, makeup, detergent, or any other topical products in her environment or lifestyle. Pt denies any changes in food consumption including any known changes in restaurants, sauces, seasonings, drinks with dye/coloring, or any other food/drink/snack products. Pt denies any known exposure to any irritatants and any known allergies aside from penicillin for which she has had no ingestion of. No known insect bites or sign of such. No  known chronic autoimmune conditions. Denies any tightness in throat or difficulty breathing, or any other signs of anaphylaxis.   Assessment: Allergic reaction of unknown origin with pruritis  Plan: Sterapred 5mg  dose pack 21 tabs (pt sensitive to prednisone and we will be conservative)  -Vistaril 25mg  po, take 1-2 q6h prn itching #42  Benjamine Mola, FNP 04/07/2017 12:25 PM

## 2017-05-16 ENCOUNTER — Ambulatory Visit
Admission: RE | Admit: 2017-05-16 | Discharge: 2017-05-16 | Disposition: A | Discharge status: Home / Self Care | Payer: BLUE CROSS/BLUE SHIELD | Source: Ambulatory Visit | Attending: Obstetrics and Gynecology | Admitting: Obstetrics and Gynecology

## 2017-05-16 DIAGNOSIS — Z1231 Encounter for screening mammogram for malignant neoplasm of breast: Secondary | ICD-10-CM | POA: Diagnosis not present

## 2017-05-16 DIAGNOSIS — D2271 Melanocytic nevi of right lower limb, including hip: Secondary | ICD-10-CM | POA: Diagnosis not present

## 2017-05-16 DIAGNOSIS — D224 Melanocytic nevi of scalp and neck: Secondary | ICD-10-CM | POA: Diagnosis not present

## 2017-05-16 DIAGNOSIS — D225 Melanocytic nevi of trunk: Secondary | ICD-10-CM | POA: Diagnosis not present

## 2017-05-16 DIAGNOSIS — D2261 Melanocytic nevi of right upper limb, including shoulder: Secondary | ICD-10-CM | POA: Diagnosis not present

## 2017-08-22 ENCOUNTER — Encounter: Payer: Self-pay | Admitting: Obstetrics and Gynecology

## 2017-08-22 ENCOUNTER — Other Ambulatory Visit: Payer: Self-pay

## 2017-08-22 ENCOUNTER — Ambulatory Visit (INDEPENDENT_AMBULATORY_CARE_PROVIDER_SITE_OTHER): Payer: BLUE CROSS/BLUE SHIELD | Admitting: Obstetrics and Gynecology

## 2017-08-22 VITALS — BP 122/70 | HR 72 | Resp 16 | Ht 66.25 in | Wt 152.0 lb

## 2017-08-22 DIAGNOSIS — N951 Menopausal and female climacteric states: Secondary | ICD-10-CM | POA: Diagnosis not present

## 2017-08-22 DIAGNOSIS — Z01419 Encounter for gynecological examination (general) (routine) without abnormal findings: Secondary | ICD-10-CM | POA: Diagnosis not present

## 2017-08-22 MED ORDER — SERTRALINE HCL 50 MG PO TABS: 50.0000 mg | ORAL_TABLET | Freq: Every day | ORAL | 3 refills | Status: DC

## 2017-08-22 NOTE — Patient Instructions (Addendum)
EXERCISE AND DIET:  We recommended that you start or continue a regular exercise program for good health. Regular exercise means any activity that makes your heart beat faster and makes you sweat.  We recommend exercising at least 30 minutes per day at least 3 days a week, preferably 4 or 5.  We also recommend a diet low in fat and sugar.  Inactivity, poor dietary choices and obesity can cause diabetes, heart attack, stroke, and kidney damage, among others.    ALCOHOL AND SMOKING:  Women should limit their alcohol intake to no more than 7 drinks/beers/glasses of wine (combined, not each!) per week. Moderation of alcohol intake to this level decreases your risk of breast cancer and liver damage. And of course, no recreational drugs are part of a healthy lifestyle.  And absolutely no smoking or even second hand smoke. Most people know smoking can cause heart and lung diseases, but did you know it also contributes to weakening of your bones? Aging of your skin?  Yellowing of your teeth and nails?  CALCIUM AND VITAMIN D:  Adequate intake of calcium and Vitamin D are recommended.  The recommendations for exact amounts of these supplements seem to change often, but generally speaking 600 mg of calcium (either carbonate or citrate) and 800 units of Vitamin D per day seems prudent. Certain women may benefit from higher intake of Vitamin D.  If you are among these women, your doctor will have told you during your visit.    PAP SMEARS:  Pap smears, to check for cervical cancer or precancers,  have traditionally been done yearly, although recent scientific advances have shown that most women can have pap smears less often.  However, every woman still should have a physical exam from her gynecologist every year. It will include a breast check, inspection of the vulva and vagina to check for abnormal growths or skin changes, a visual exam of the cervix, and then an exam to evaluate the size and shape of the uterus and  ovaries.  And after 52 years of age, a rectal exam is indicated to check for rectal cancers. We will also provide age appropriate advice regarding health maintenance, like when you should have certain vaccines, screening for sexually transmitted diseases, bone density testing, colonoscopy, mammograms, etc.   MAMMOGRAMS:  All women over 40 years old should have a yearly mammogram. Many facilities now offer a "3D" mammogram, which may cost around $50 extra out of pocket. If possible,  we recommend you accept the option to have the 3D mammogram performed.  It both reduces the number of women who will be called back for extra views which then turn out to be normal, and it is better than the routine mammogram at detecting truly abnormal areas.    COLONOSCOPY:  Colonoscopy to screen for colon cancer is recommended for all women at age 50.  We know, you hate the idea of the prep.  We agree, BUT, having colon cancer and not knowing it is worse!!  Colon cancer so often starts as a polyp that can be seen and removed at colonscopy, which can quite literally save your life!  And if your first colonoscopy is normal and you have no family history of colon cancer, most women don't have to have it again for 10 years.  Once every ten years, you can do something that may end up saving your life, right?  We will be happy to help you get it scheduled when you are ready.    Be sure to check your insurance coverage so you understand how much it will cost.  It may be covered as a preventative service at no cost, but you should check your particular policy.      Kegel Exercises Kegel exercises help strengthen the muscles that support the rectum, vagina, small intestine, bladder, and uterus. Doing Kegel exercises can help:  Improve bladder and bowel control.  Improve sexual response.  Reduce problems and discomfort during pregnancy.  Kegel exercises involve squeezing your pelvic floor muscles, which are the same muscles you  squeeze when you try to stop the flow of urine. The exercises can be done while sitting, standing, or lying down, but it is best to vary your position. Phase 1 exercises 1. Squeeze your pelvic floor muscles tight. You should feel a tight lift in your rectal area. If you are a female, you should also feel a tightness in your vaginal area. Keep your stomach, buttocks, and legs relaxed. 2. Hold the muscles tight for up to 10 seconds. 3. Relax your muscles. Repeat this exercise 50 times a day or as many times as told by your health care provider. Continue to do this exercise for at least 4-6 weeks or for as long as told by your health care provider. This information is not intended to replace advice given to you by your health care provider. Make sure you discuss any questions you have with your health care provider. Document Released: 06/12/2012 Document Revised: 02/19/2016 Document Reviewed: 05/16/2015 Elsevier Interactive Patient Education  2018 [employer] Inc.  

## 2017-08-22 NOTE — Progress Notes (Signed)
52 y.o. G8P2002 Married Caucasian female here for annual exam.    Had a bilateral rash under her arms after going to a foot ball game.  Tx with prednisone and rash persists but is better.   Son going to [place] next year.   Leak of urine with sneeze/cough. Using a panty liner.   No menses with Mirena.  Hands and feet warm at night.  No hot flashes during the day.   Feeling really tired after sleeping for 9 - 10 hours.   Zoloft working well and wants refill.   PCP:  Dr. Virgina Jock  Patient's last menstrual period was 07/11/2007 (within years).           Sexually active: Yes.    The current method of family planning is IUD--inserted 06-11-13.    Exercising: Yes.    walking and cardio Smoker:  no  Health Maintenance: Pap:  07-28-15 Neg:Neg HR HPV; 05-01-12 Neg:Neg HR HPV History of abnormal Pap:  no MMG:  05/16/17 BIRADS 1 negative/density c Colonoscopy:  10/25/16 Benign Polyp removed BMD:   n/a  Result  n/a TDaP:  2011 Gardasil:   n/a HIV: done in the past -- negative Hep C: unsure Screening Labs:  Discuss today   reports that  has never smoked. she has never used smokeless tobacco. She reports that she drinks alcohol. She reports that she does not use drugs.  Past Medical History:  Diagnosis Date  . Anxiety    panic  . Elevated LDL cholesterol level   . Endometriosis   . Microscopic hematuria    January 2017     Past Surgical History:  Procedure Laterality Date  . abdomnioplasty  06/2003  . LAPAROSCOPIC OOPHERECTOMY  2011  . PELVIC LAPAROSCOPY  03-01-10   LSO secondary torsion, endometriosis--Dr. Caren Griffins Romine  . RETINAL TEAR REPAIR CRYOTHERAPY Left 11/2014    Current Outpatient Medications  Medication Sig Dispense Refill  . ALPRAZolam (XANAX) 0.25 MG tablet Take 1 tablet (0.25 mg total) by mouth 3 (three) times daily as needed for anxiety. 30 tablet 0  . levonorgestrel (MIRENA) 20 MCG/24HR IUD 1 each by Intrauterine route once.    . sertraline (ZOLOFT) 50 MG  tablet Take 1 tablet (50 mg total) by mouth daily. 90 tablet 3   Current Facility-Administered Medications  Medication Dose Route Frequency Provider Last Rate Last Dose  . 0.9 %  sodium chloride infusion  500 mL Intravenous Continuous Pyrtle, Lajuan Lines, MD        Family History  Problem Relation Age of Onset  . Hypertension Father   . Hyperlipidemia Mother   . Osteoporosis Mother   . Heart failure Maternal Grandmother        CHF  . Heart failure Paternal Grandmother        CHF  . Rheum arthritis Paternal Grandmother   . Colon cancer Neg Hx   . Esophageal cancer Neg Hx   . Stomach cancer Neg Hx   . Rectal cancer Neg Hx     ROS:  Pertinent items are noted in HPI.  Otherwise, a comprehensive ROS was negative.  Exam:   BP 122/70 (BP Location: Right Arm, Patient Position: Sitting, Cuff Size: Normal)   Pulse 72   Resp 16   Ht 5' 6.25" (1.683 m)   Wt 152 lb (68.9 kg)   LMP 07/11/2007 (Within Years)   BMI 24.35 kg/m     General appearance: alert, cooperative and appears stated age Head: Normocephalic, without obvious abnormality, atraumatic  Neck: no adenopathy, supple, symmetrical, trachea midline and thyroid normal to inspection and palpation Lungs: clear to auscultation bilaterally Breasts: normal appearance, no masses or tenderness, No nipple retraction or dimpling, No nipple discharge or bleeding, No axillary or supraclavicular adenopathy Heart: regular rate and rhythm Abdomen: soft, non-tender; no masses, no organomegaly Extremities: extremities normal, atraumatic, no cyanosis or edema Skin: Skin color, texture, turgor normal. No rashes or lesions Lymph nodes: Cervical, supraclavicular, and axillary nodes normal. No abnormal inguinal nodes palpated Neurologic: Grossly normal  Pelvic: External genitalia:  no lesions              Urethra:  normal appearing urethra with no masses, tenderness or lesions              Bartholins and Skenes: normal                 Vagina: normal  appearing vagina with normal color and discharge, no lesions              Cervix: no lesions.. IUD strings noted.  Small cervical polyp.              Pap taken: No. Bimanual Exam:  Uterus:  normal size, contour, position, consistency, mobility, non-tender              Adnexa: no mass, fullness, tenderness              Rectal exam:  Declined.  Chaperone was present for exam.  Assessment:   Well woman visit with normal exam. Mirena IUD patient.  Menopausal symptoms. Stress incontinence.  Fatigue. Anxiety.  On Zoloft.  Cervical polyp. Elevated cholesterol.  Plan: Mammogram screening discussed. Recommended self breast awareness. Pap and HR HPV as above. Guidelines for Calcium, Vitamin D, regular exercise program including cardiovascular and weight bearing exercise. Check CBC, TSH, FSH, estradiol. Kegel's reviewed. IUD due for exchange at end of the year if not in menopause.  Refill of Zoloft 50 mg daily.  Lipid profile and Shingles vaccine with PCP.  Follow up annually and prn.     After visit summary provided.

## 2017-08-23 ENCOUNTER — Other Ambulatory Visit: Payer: Self-pay | Admitting: Obstetrics and Gynecology

## 2017-08-23 LAB — CBC
HEMATOCRIT: 41.9 % (ref 34.0–46.6)
Hemoglobin: 13.5 g/dL (ref 11.1–15.9)
MCH: 28.1 pg (ref 26.6–33.0)
MCHC: 32.2 g/dL (ref 31.5–35.7)
MCV: 87 fL (ref 79–97)
PLATELETS: 296 10*3/uL (ref 150–379)
RBC: 4.81 x10E6/uL (ref 3.77–5.28)
RDW: 14 % (ref 12.3–15.4)
WBC: 8.6 10*3/uL (ref 3.4–10.8)

## 2017-08-23 LAB — ESTRADIOL: ESTRADIOL: 16.3 pg/mL

## 2017-08-23 LAB — FOLLICLE STIMULATING HORMONE: FSH: 50.2 m[IU]/mL

## 2017-08-23 LAB — TSH: TSH: 1.88 u[IU]/mL (ref 0.450–4.500)

## 2017-08-29 ENCOUNTER — Ambulatory Visit (INDEPENDENT_AMBULATORY_CARE_PROVIDER_SITE_OTHER): Payer: BLUE CROSS/BLUE SHIELD | Admitting: Obstetrics and Gynecology

## 2017-08-29 ENCOUNTER — Encounter: Payer: Self-pay | Admitting: Obstetrics and Gynecology

## 2017-08-29 ENCOUNTER — Other Ambulatory Visit: Payer: Self-pay

## 2017-08-29 ENCOUNTER — Encounter: Payer: Self-pay | Admitting: Family Medicine

## 2017-08-29 VITALS — BP 116/70 | HR 72 | Resp 16 | Wt 153.0 lb

## 2017-08-29 DIAGNOSIS — Z780 Asymptomatic menopausal state: Secondary | ICD-10-CM

## 2017-08-29 DIAGNOSIS — Z7185 Encounter for immunization safety counseling: Secondary | ICD-10-CM

## 2017-08-29 DIAGNOSIS — Z7189 Other specified counseling: Secondary | ICD-10-CM

## 2017-08-29 NOTE — Progress Notes (Signed)
GYNECOLOGY  VISIT   HPI: 52 y.o.   Married  Caucasian  female   G2P2002 with No LMP recorded. Patient is not currently having periods (Reason: IUD).   here for  Consult HRT.  Estradiol 16.3 and FSH 50.2 on 08/22/17. Feels her hands and feet more warm.   IUD due for removal 06/2018.   On Zoloft.   Needs an Rx for shingles vaccine.   GYNECOLOGIC HISTORY: No LMP recorded. Patient is not currently having periods (Reason: IUD). Contraception:  IUD inserted 06/11/13 Menopausal hormone therapy:  none Last mammogram:  05/16/17 BIRADS 1 negative/density c Last pap smear:   07/28/15 Pap and HR HPV Negative        OB History    Gravida Para Term Preterm AB Living   2 2 2     2    SAB TAB Ectopic Multiple Live Births                     Patient Active Problem List   Diagnosis Date Noted  . Allergic reaction 04/07/2017    Past Medical History:  Diagnosis Date  . Anxiety    panic  . Elevated LDL cholesterol level   . Endometriosis   . Microscopic hematuria    January 2017     Past Surgical History:  Procedure Laterality Date  . abdomnioplasty  06/2003  . LAPAROSCOPIC OOPHERECTOMY  2011  . PELVIC LAPAROSCOPY  03-01-10   LSO secondary torsion, endometriosis--Dr. Caren Griffins Romine  . RETINAL TEAR REPAIR CRYOTHERAPY Left 11/2014    Current Outpatient Medications  Medication Sig Dispense Refill  . ALPRAZolam (XANAX) 0.25 MG tablet Take 1 tablet (0.25 mg total) by mouth 3 (three) times daily as needed for anxiety. 30 tablet 0  . levonorgestrel (MIRENA) 20 MCG/24HR IUD 1 each by Intrauterine route once.    . sertraline (ZOLOFT) 50 MG tablet Take 1 tablet (50 mg total) by mouth daily. 90 tablet 3   Current Facility-Administered Medications  Medication Dose Route Frequency Provider Last Rate Last Dose  . 0.9 %  sodium chloride infusion  500 mL Intravenous Continuous Pyrtle, Lajuan Lines, MD         ALLERGIES: Sulfa antibiotics and Penicillins  Family History  Problem Relation Age of  Onset  . Hypertension Father   . Hyperlipidemia Mother   . Osteoporosis Mother   . Heart failure Maternal Grandmother        CHF  . Heart failure Paternal Grandmother        CHF  . Rheum arthritis Paternal Grandmother   . Colon cancer Neg Hx   . Esophageal cancer Neg Hx   . Stomach cancer Neg Hx   . Rectal cancer Neg Hx     Social History   Socioeconomic History  . Marital status: Married    Spouse name: Not on file  . Number of children: Not on file  . Years of education: Not on file  . Highest education level: Not on file  Social Needs  . Financial resource strain: Not on file  . Food insecurity - worry: Not on file  . Food insecurity - inability: Not on file  . Transportation needs - medical: Not on file  . Transportation needs - non-medical: Not on file  Occupational History  . Not on file  Tobacco Use  . Smoking status: Never Smoker  . Smokeless tobacco: Never Used  Substance and Sexual Activity  . Alcohol use: Yes    Alcohol/week: 0.0  oz    Comment: 1-2 drinks per month  . Drug use: No  . Sexual activity: Yes    Partners: Male    Birth control/protection: IUD    Comment: Mirena--inserted12-3-14  Other Topics Concern  . Not on file  Social History Narrative  . Not on file    ROS:  Pertinent items are noted in HPI.  PHYSICAL EXAMINATION:    BP 116/70 (BP Location: Right Arm, Patient Position: Sitting, Cuff Size: Normal)   Pulse 72   Resp 16   Wt 153 lb (69.4 kg)   BMI 24.51 kg/m     General appearance: alert, cooperative and appears stated age   ASSESSMENT  Menopause.  Mirena IUD.  On Zoloft.  PLAN  We discussed menopause, signs and symptoms, effect on health, and treatment options - HRT, SSRI/SNRIs. We reviewed the WHI and risks and benefits of HRT.  Her Mirena IUD may provide the progesterone component of HRT.  She declines HRT.  She will continue with Zoloft.  Brochure on menopause.  Rx for Shingrix. FU prn.   An After Visit Summary  was printed and given to the patient.  __25____ minutes face to face time of which over 50% was spent in counseling.

## 2017-08-29 NOTE — Patient Instructions (Signed)
Recombinant Zoster (Shingles) Vaccine, RZV: What You Need to Know    1. Why get vaccinated?  Shingles (also called herpes zoster, or just zoster) is a painful skin rash, often with blisters. Shingles is caused by the varicella zoster virus, the same virus that causes chickenpox. After you have chickenpox, the virus stays in your body and can cause shingles later in life.  You can't catch shingles from another person. However, a person who has never had chickenpox (or chickenpox vaccine) could get chickenpox from someone with shingles.  A shingles rash usually appears on one side of the face or body and heals within 2 to 4 weeks. Its main symptom is pain, which can be severe. Other symptoms can include fever, headache, chills and upset stomach. Very rarely, a shingles infection can lead to pneumonia, hearing problems, blindness, brain inflammation (encephalitis), or death.  For about 1 person in 5, severe pain can continue even long after the rash has cleared up. This long-lasting pain is called post-herpetic neuralgia (PHN).  Shingles is far more common in people 50 years of age and older than in younger people, and the risk increases with age. It is also more common in people whose immune system is weakened because of a disease such as cancer, or by drugs such as steroids or chemotherapy.  At least 1 million people a year in the [place] get shingles.    2. Shingles vaccine (recombinant)  Recombinant shingles vaccine was approved by FDA in 2017 for the prevention of shingles. In clinical trials, it was more than 90% effective in preventing shingles. It can also reduce the likelihood of PHN.  Two doses, 2 to 6 months apart, are recommended for adults 50 and older.  This vaccine is also recommended for people who have already gotten the live shingles vaccine (Zostavax). There is no live virus in this vaccine.    3. Some people should not get this vaccine  Tell your vaccine provider if you:   Have any  severe, life-threatening allergies. A person who has ever had a life-threatening allergic reaction after a dose of recombinant shingles vaccine, or has a severe allergy to any component of this vaccine, may be advised not to be vaccinated. Ask your health care provider if you want information about vaccine components.   Are pregnant or breastfeeding. There is not much information about use of recombinant shingles vaccine in pregnant or nursing women. Your healthcare provider might recommend delaying vaccination.   Are not feeling well. If you have a mild illness, such as a cold, you can probably get the vaccine today. If you are moderately or severely ill, you should probably wait until you recover. Your doctor can advise you.    4. Risks of a vaccine reaction  With any medicine, including vaccines, there is a chance of reactions.  After recombinant shingles vaccination, a person might experience:   Pain, redness, soreness, or swelling at the site of the injection   Headache, muscle aches, fever, shivering, fatigue    In clinical trials, most people got a sore arm with mild or moderate pain after vaccination, and some also had redness and swelling where they got the shot. Some people felt tired, had muscle pain, a headache, shivering, fever, stomach pain, or nausea. About 1 out of 6 people who got recombinant zoster vaccine experienced side effects that prevented them from doing regular activities. Symptoms went away on their own in about 2 to 3 days. Side effects were more   common in younger people.  You should still get the second dose of recombinant zoster vaccine even if you had one of these reactions after the first dose.    Other things that could happen after this vaccine:   People sometimes faint after medical procedures, including vaccination. Sitting or lying down for about 15 minutes can help prevent fainting and injuries caused by a fall. Tell your provider if you feel dizzy or have vision changes or  ringing in the ears.   Some people get shoulder pain that can be more severe and longer-lasting than routine soreness that can follow injections. This happens very rarely.   Any medication can cause a severe allergic reaction. Such reactions to a vaccine are estimated at about 1 in a million doses, and would happen within a few minutes to a few hours after the vaccination.  As with any medicine, there is a very remote chance of a vaccine causing a serious injury or death.  The safety of vaccines is always being monitored. For more information, visit: www.cdc.gov/vaccinesafety/    5. What if there is a serious problem?  What should I look for?   Look for anything that concerns you, such as signs of a severe allergic reaction, very high fever, or unusual behavior.  Signs of a severe allergic reaction can include hives, swelling of the face and throat, difficulty breathing, a fast heartbeat, dizziness, and weakness. These would usually start a few minutes to a few hours after the vaccination.    What should I do?   If you think it is a severe allergic reaction or other emergency that can't wait, call 9-1-1 and get to the nearest hospital. Otherwise, call your health care provider.  Afterward, the reaction should be reported to the Vaccine Adverse Event Reporting System (VAERS). Your doctor should file this report, or you can do it yourself through the VAERS web site atwww.vaers.hhs.govor by calling 1-800-822-7967.  VAERS does not give medical advice.    6. How can I learn more?   Ask your healthcare provider. He or she can give you the vaccine package insert or suggest other sources of information.   Call your local or state health department.   Contact the Centers for Disease Control and Prevention (CDC):  ? Call 1-800-232-4636 (1-800-CDC-INFO) or  ? Visit the CDC's website at www.cdc.gov/vaccines  ?   CDC Vaccine Information Statement (VIS) Recombinant Zoster Vaccine (08/21/2016)  This information is not  intended to replace advice given to you by your health care provider. Make sure you discuss any questions you have with your health care provider.  Document Released: 09/05/2016 Document Revised: 09/05/2016 Document Reviewed: 09/05/2016  Elsevier Interactive Patient Education  2018 [employer]

## 2017-08-31 MED ORDER — ZOSTER VAC RECOMB ADJUVANTED 50 MCG/0.5ML IM SUSR: 0.5000 mL | Freq: Once | INTRAMUSCULAR | 0 refills | Status: AC

## 2017-09-05 DIAGNOSIS — G5603 Carpal tunnel syndrome, bilateral upper limbs: Secondary | ICD-10-CM | POA: Insufficient documentation

## 2017-09-05 DIAGNOSIS — R2231 Localized swelling, mass and lump, right upper limb: Secondary | ICD-10-CM | POA: Diagnosis not present

## 2017-09-12 ENCOUNTER — Encounter: Payer: Self-pay | Admitting: Family Medicine

## 2017-09-12 ENCOUNTER — Ambulatory Visit (INDEPENDENT_AMBULATORY_CARE_PROVIDER_SITE_OTHER): Payer: BLUE CROSS/BLUE SHIELD | Admitting: Family Medicine

## 2017-09-12 VITALS — BP 110/62 | HR 80 | Temp 98.2°F | Ht 66.5 in | Wt 150.0 lb

## 2017-09-12 DIAGNOSIS — E7800 Pure hypercholesterolemia, unspecified: Secondary | ICD-10-CM

## 2017-09-12 DIAGNOSIS — Z0000 Encounter for general adult medical examination without abnormal findings: Secondary | ICD-10-CM

## 2017-09-12 LAB — LIPID PANEL
Cholesterol: 232 mg/dL — ABNORMAL HIGH (ref 0–200)
HDL: 58.9 mg/dL (ref >39.00)
LDL Cholesterol: 152 mg/dL — ABNORMAL HIGH (ref 0–99)
NONHDL: 173.36
Total CHOL/HDL Ratio: 4
Triglycerides: 106 mg/dL (ref 0.0–149.0)
VLDL: 21.2 mg/dL (ref 0.0–40.0)

## 2017-09-12 NOTE — Patient Instructions (Addendum)
Preventive Care 40-64 Years, Female Preventive care refers to lifestyle choices and visits with your health care provider that can promote health and wellness. What does preventive care include?  A yearly physical exam. This is also called an annual well check.  Dental exams once or twice a year.  Routine eye exams. Ask your health care provider how often you should have your eyes checked.  Personal lifestyle choices, including: ? Daily care of your teeth and gums. ? Regular physical activity. ? Eating a healthy diet. ? Avoiding tobacco and drug use. ? Limiting alcohol use. ? Practicing safe sex. ? Taking low-dose aspirin daily starting at age 58. ? Taking vitamin and mineral supplements as recommended by your health care provider. What happens during an annual well check? The services and screenings done by your health care provider during your annual well check will depend on your age, overall health, lifestyle risk factors, and family history of disease. Counseling Your health care provider may ask you questions about your:  Alcohol use.  Tobacco use.  Drug use.  Emotional well-being.  Home and relationship well-being.  Sexual activity.  Eating habits.  Work and work Statistician.  Method of birth control.  Menstrual cycle.  Pregnancy history.  Screening You may have the following tests or measurements:  Height, weight, and BMI.  Blood pressure.  Lipid and cholesterol levels. These may be checked every 5 years, or more frequently if you are over 81 years old.  Skin check.  Lung cancer screening. You may have this screening every year starting at age 78 if you have a 30-pack-year history of smoking and currently smoke or have quit within the past 15 years.  Fecal occult blood test (FOBT) of the stool. You may have this test every year starting at age 65.  Flexible sigmoidoscopy or colonoscopy. You may have a sigmoidoscopy every 5 years or a colonoscopy  every 10 years starting at age 30.  Hepatitis C blood test.  Hepatitis B blood test.  Sexually transmitted disease (STD) testing.  Diabetes screening. This is done by checking your blood sugar (glucose) after you have not eaten for a while (fasting). You may have this done every 1-3 years.  Mammogram. This may be done every 1-2 years. Talk to your health care provider about when you should start having regular mammograms. This may depend on whether you have a family history of breast cancer.  BRCA-related cancer screening. This may be done if you have a family history of breast, ovarian, tubal, or peritoneal cancers.  Pelvic exam and Pap test. This may be done every 3 years starting at age 80. Starting at age 36, this may be done every 5 years if you have a Pap test in combination with an HPV test.  Bone density scan. This is done to screen for osteoporosis. You may have this scan if you are at high risk for osteoporosis.  Discuss your test results, treatment options, and if necessary, the need for more tests with your health care provider. Vaccines Your health care provider may recommend certain vaccines, such as:  Influenza vaccine. This is recommended every year.  Tetanus, diphtheria, and acellular pertussis (Tdap, Td) vaccine. You may need a Td booster every 10 years.  Varicella vaccine. You may need this if you have not been vaccinated.  Zoster vaccine. You may need this after age 5.  Measles, mumps, and rubella (MMR) vaccine. You may need at least one dose of MMR if you were born in  1957 or later. You may also need a second dose.  Pneumococcal 13-valent conjugate (PCV13) vaccine. You may need this if you have certain conditions and were not previously vaccinated.  Pneumococcal polysaccharide (PPSV23) vaccine. You may need one or two doses if you smoke cigarettes or if you have certain conditions.  Meningococcal vaccine. You may need this if you have certain  conditions.  Hepatitis A vaccine. You may need this if you have certain conditions or if you travel or work in places where you may be exposed to hepatitis A.  Hepatitis B vaccine. You may need this if you have certain conditions or if you travel or work in places where you may be exposed to hepatitis B.  Haemophilus influenzae type b (Hib) vaccine. You may need this if you have certain conditions.  Talk to your health care provider about which screenings and vaccines you need and how often you need them. This information is not intended to replace advice given to you by your health care provider. Make sure you discuss any questions you have with your health care provider. Document Released: 07/23/2015 Document Revised: 03/15/2016 Document Reviewed: 04/27/2015 Elsevier Interactive Patient Education  2018 Elsevier Inc.  High Cholesterol High cholesterol is a condition in which the blood has high levels of a white, waxy, fat-like substance (cholesterol). The human body needs small amounts of cholesterol. The liver makes all the cholesterol that the body needs. Extra (excess) cholesterol comes from the food that we eat. Cholesterol is carried from the liver by the blood through the blood vessels. If you have high cholesterol, deposits (plaques) may build up on the walls of your blood vessels (arteries). Plaques make the arteries narrower and stiffer. Cholesterol plaques increase your risk for heart attack and stroke. Work with your health care provider to keep your cholesterol levels in a healthy range. What increases the risk? This condition is more likely to develop in people who:  Eat foods that are high in animal fat (saturated fat) or cholesterol.  Are overweight.  Are not getting enough exercise.  Have a family history of high cholesterol.  What are the signs or symptoms? There are no symptoms of this condition. How is this diagnosed? This condition may be diagnosed from the results  of a blood test.  If you are older than age 20, your health care provider may check your cholesterol every 4-6 years.  You may be checked more often if you already have high cholesterol or other risk factors for heart disease.  The blood test for cholesterol measures:  "Bad" cholesterol (LDL cholesterol). This is the main type of cholesterol that causes heart disease. The desired level for LDL is less than 100.  "Good" cholesterol (HDL cholesterol). This type helps to protect against heart disease by cleaning the arteries and carrying the LDL away. The desired level for HDL is 60 or higher.  Triglycerides. These are fats that the body can store or burn for energy. The desired number for triglycerides is lower than 150.  Total cholesterol. This is a measure of the total amount of cholesterol in your blood, including LDL cholesterol, HDL cholesterol, and triglycerides. A healthy number is less than 200.  How is this treated? This condition is treated with diet changes, lifestyle changes, and medicines. Diet changes  This may include eating more whole grains, fruits, vegetables, nuts, and fish.  This may also include cutting back on red meat and foods that have a lot of added sugar. Lifestyle changes    Changes may include getting at least 40 minutes of aerobic exercise 3 times a week. Aerobic exercises include walking, biking, and swimming. Aerobic exercise along with a healthy diet can help you maintain a healthy weight.  Changes may also include quitting smoking. Medicines  Medicines are usually given if diet and lifestyle changes have failed to reduce your cholesterol to healthy levels.  Your health care provider may prescribe a statin medicine. Statin medicines have been shown to reduce cholesterol, which can reduce the risk of heart disease. Follow these instructions at home: Eating and drinking  If told by your health care provider:  Eat chicken (without skin), fish, veal,  shellfish, ground [place] breast, and round or loin cuts of red meat.  Do not eat fried foods or fatty meats, such as hot dogs and salami.  Eat plenty of fruits, such as apples.  Eat plenty of vegetables, such as broccoli, potatoes, and carrots.  Eat beans, peas, and lentils.  Eat grains such as barley, rice, couscous, and bulgur wheat.  Eat pasta without cream sauces.  Use skim or nonfat milk, and eat low-fat or nonfat yogurt and cheeses.  Do not eat or drink whole milk, cream, ice cream, egg yolks, or hard cheeses.  Do not eat stick margarine or tub margarines that contain trans fats (also called partially hydrogenated oils).  Do not eat saturated tropical oils, such as coconut oil and palm oil.  Do not eat cakes, cookies, crackers, or other baked goods that contain trans fats.  General instructions  Exercise as directed by your health care provider. Increase your activity level with activities such as gardening, walking, and taking the stairs.  Take over-the-counter and prescription medicines only as told by your health care provider.  Do not use any products that contain nicotine or tobacco, such as cigarettes and e-cigarettes. If you need help quitting, ask your health care provider.  Keep all follow-up visits as told by your health care provider. This is important. Contact a health care provider if:  You are struggling to maintain a healthy diet or weight.  You need help to start on an exercise program.  You need help to stop smoking. Get help right away if:  You have chest pain.  You have trouble breathing. This information is not intended to replace advice given to you by your health care provider. Make sure you discuss any questions you have with your health care provider. Document Released: 06/26/2005 Document Revised: 01/22/2016 Document Reviewed: 12/25/2015 Elsevier Interactive Patient Education  [employer].

## 2017-09-12 NOTE — Progress Notes (Signed)
Patient presents to clinic today for CPE and  to establish care.  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  Pt is followed by GI and OB/Gyn.  She has not had a primary in yrs.  She was last seen by Dr. Virgina Jock.  HLD: -per pt her cholesterol is always high. -she is not on any cholesterol medication. -she stays active -pt does not eat many vegetables.  Allergies: Sulfa-rash Penicillin-rash  Past surgical history: abdominoplasty 06/2003 Oophorectomy, left for ovarian cyst 02/2010  Social history: Patient is married.  She has 2 sons.  Her eldest son will graduate high school this year and has plans on attending [employer].  Patient is employed as a Pharmacist, community.  Patient endorses social alcohol use.  Patient denies tobacco or drug use.  Patient premenopausal has IUD in place however.  IUD due for removal 06/2018.  Per chart review patient is on Zoloft.  Family medical history: Mom-alive, HLD Dad-alive, HTN Brother-Paul, alive, HLD MGM-deceased, COPD, heart disease, HLD MGF-deceased PGM-deceased, arthritis, COPD PGF-deceased, arthritis, hearing loss   Health Maintenance: Immunizations --2018 influenza vaccine Colonoscopy --2018 Mammogram --up-to-date PAP --08/2017.  Patient is followed by OB/GYN Dr. Josefa Half  Pt interested in Shingles vaccine.  Advised to contact her local pharmacy for availability. Past Medical History:  Diagnosis Date  . Anxiety    panic  . Elevated LDL cholesterol level   . Endometriosis   . Microscopic hematuria    January 2017     Past Surgical History:  Procedure Laterality Date  . abdomnioplasty  06/2003  . LAPAROSCOPIC OOPHERECTOMY  2011  . PELVIC LAPAROSCOPY  03-01-10   LSO secondary torsion, endometriosis--Dr. Caren Griffins Romine  . RETINAL TEAR REPAIR CRYOTHERAPY Left 11/2014    Current Outpatient Medications on File Prior to Visit  Medication Sig Dispense Refill  . ALPRAZolam (XANAX) 0.25 MG tablet Take  1 tablet (0.25 mg total) by mouth 3 (three) times daily as needed for anxiety. 30 tablet 0  . levonorgestrel (MIRENA) 20 MCG/24HR IUD 1 each by Intrauterine route once.    . sertraline (ZOLOFT) 50 MG tablet Take 1 tablet (50 mg total) by mouth daily. 90 tablet 3   Current Facility-Administered Medications on File Prior to Visit  Medication Dose Route Frequency Provider Last Rate Last Dose  . 0.9 %  sodium chloride infusion  500 mL Intravenous Continuous Pyrtle, Lajuan Lines, MD        Allergies  Allergen Reactions  . Sulfa Antibiotics   . Penicillins Rash    Family History  Problem Relation Age of Onset  . Hypertension Father   . Hyperlipidemia Mother   . Osteoporosis Mother   . Heart failure Maternal Grandmother        CHF  . Heart failure Paternal Grandmother        CHF  . Rheum arthritis Paternal Grandmother   . Colon cancer Neg Hx   . Esophageal cancer Neg Hx   . Stomach cancer Neg Hx   . Rectal cancer Neg Hx     Social History   Socioeconomic History  . Marital status: Married    Spouse name: Not on file  . Number of children: Not on file  . Years of education: Not on file  . Highest education level: Not on file  Social Needs  . Financial resource strain: Not on file  . Food insecurity - worry: Not on file  . Food insecurity -  inability: Not on file  . Transportation needs - medical: Not on file  . Transportation needs - non-medical: Not on file  Occupational History  . Occupation: Pharmacist, community   Tobacco Use  . Smoking status: Never Smoker  . Smokeless tobacco: Never Used  Substance and Sexual Activity  . Alcohol use: Yes    Alcohol/week: 0.0 oz    Comment: 1-2 drinks per month  . Drug use: No  . Sexual activity: Yes    Partners: Male    Birth control/protection: IUD    Comment: Mirena--inserted12-3-14  Other Topics Concern  . Not on file  Social History Narrative  . Not on file    ROS General: Denies fever, chills, night sweats, changes in weight, changes  in appetite HEENT: Denies headaches, ear pain, changes in vision, rhinorrhea, sore throat CV: Denies CP, palpitations, SOB, orthopnea Pulm: Denies SOB, cough, wheezing GI: Denies abdominal pain, nausea, vomiting, diarrhea, constipation GU: Denies dysuria, hematuria, frequency, vaginal discharge Msk: Denies muscle cramps, joint pains Neuro: Denies weakness, numbness, tingling Skin: Denies rashes, bruising Psych: Denies depression, anxiety, hallucinations  BP 110/62 (BP Location: Right Arm, Patient Position: Sitting, Cuff Size: Normal)   Pulse 80   Temp 98.2 F (36.8 C) (Oral)   Ht 5' 6.5" (1.689 m)   Wt 150 lb (68 kg)   BMI 23.85 kg/m   Physical Exam Gen. Pleasant, well developed, well-nourished, in NAD HEENT - [place]/AT, PERRL, no scleral icterus, no nasal drainage, pharynx without erythema or exudate. Neck: No JVD, no thyromegaly, no carotid bruits Lungs: no use of accessory muscles, CTAB, no wheezes, rales or rhonchi Cardiovascular: RRR, No r/g/m, no peripheral edema Abdomen: BS present, soft, nontender,nondistended, no hepatosplenomegaly Musculoskeletal: No deformities, moves all four extremities, no cyanosis or clubbing, normal tone Neuro:  A&Ox3, CN II-XII intact, normal gait Skin:  Warm, dry, intact, no lesions Psych: normal affect, mood appropriate  Recent Results (from the past 2160 hour(s))  TSH     Status: None   Collection Time: 08/22/17  2:51 PM  Result Value Ref Range   TSH 1.880 0.450 - 1.829 uIU/mL  Follicle stimulating hormone     Status: None   Collection Time: 08/22/17  2:51 PM  Result Value Ref Range   FSH 50.2 mIU/mL    Comment:                     Adult Female:                       Follicular phase      3.5 -  12.5                       Ovulation phase       4.7 -  21.5                       Luteal phase          1.7 -   7.7                       Postmenopausal       25.8 - 134.8   Estradiol     Status: None   Collection Time: 08/22/17  2:51 PM    Result Value Ref Range   Estradiol 16.3 pg/mL    Comment:  Adult Female:                       Follicular phase   18.8 -   166.0                       Ovulation phase    85.8 -   498.0                       Luteal phase       43.8 -   211.0                       Postmenopausal     <6.0 -    54.7                     Pregnancy                       1st trimester     215.0 - >4300.0                     Girls (1-10 years)    6.0 -    27.0 Roche ECLIA methodology   CBC     Status: None   Collection Time: 08/22/17  2:51 PM  Result Value Ref Range   WBC 8.6 3.4 - 10.8 x10E3/uL   RBC 4.81 3.77 - 5.28 x10E6/uL   Hemoglobin 13.5 11.1 - 15.9 g/dL   Hematocrit 41.9 34.0 - 46.6 %   MCV 87 79 - 97 fL   MCH 28.1 26.6 - 33.0 pg   MCHC 32.2 31.5 - 35.7 g/dL   RDW 14.0 12.3 - 15.4 %   Platelets 296 150 - 379 x10E3/uL    Assessment/Plan: Well adult exam -Anticipatory guidance given including wearing seatbelts, smoke detectors in the home, increasing physical activity, increasing p.o. intake of vegetables. -Patient given handout -Pap and mammogram up-to-date -IUD in place -Next CPE in 1 year  Pure hypercholesterolemia - Plan: Lipid panel  Follow-up PRN  Grier Mitts, MD

## 2017-09-19 ENCOUNTER — Encounter: Payer: Self-pay | Admitting: Family Medicine

## 2017-09-19 DIAGNOSIS — R21 Rash and other nonspecific skin eruption: Secondary | ICD-10-CM | POA: Diagnosis not present

## 2017-09-19 DIAGNOSIS — L299 Pruritus, unspecified: Secondary | ICD-10-CM | POA: Diagnosis not present

## 2017-11-02 DIAGNOSIS — H25013 Cortical age-related cataract, bilateral: Secondary | ICD-10-CM | POA: Diagnosis not present

## 2017-11-02 DIAGNOSIS — H31002 Unspecified chorioretinal scars, left eye: Secondary | ICD-10-CM | POA: Diagnosis not present

## 2017-11-02 DIAGNOSIS — H5213 Myopia, bilateral: Secondary | ICD-10-CM | POA: Diagnosis not present

## 2017-11-02 DIAGNOSIS — H2512 Age-related nuclear cataract, left eye: Secondary | ICD-10-CM | POA: Diagnosis not present

## 2017-11-07 ENCOUNTER — Encounter: Payer: Self-pay | Admitting: Obstetrics and Gynecology

## 2017-12-13 DIAGNOSIS — S60460A Insect bite (nonvenomous) of right index finger, initial encounter: Secondary | ICD-10-CM | POA: Diagnosis not present

## 2017-12-13 DIAGNOSIS — G5603 Carpal tunnel syndrome, bilateral upper limbs: Secondary | ICD-10-CM | POA: Diagnosis not present

## 2017-12-13 DIAGNOSIS — R2231 Localized swelling, mass and lump, right upper limb: Secondary | ICD-10-CM | POA: Diagnosis not present

## 2017-12-13 DIAGNOSIS — W57XXXA Bitten or stung by nonvenomous insect and other nonvenomous arthropods, initial encounter: Secondary | ICD-10-CM | POA: Diagnosis not present

## 2018-01-24 ENCOUNTER — Telehealth: Payer: Self-pay

## 2018-01-24 NOTE — Telephone Encounter (Signed)
Please advise does patient need a follow up or just labs?

## 2018-01-24 NOTE — Telephone Encounter (Signed)
Copied from [place] [id number]. Topic: Inquiry >> Jan 24, 2018  8:27 AM Kristin Greene, NT wrote: Reason for CRM: patient is calling and states she was told she could come in and get her cholesterol rechecked. I do not see a order, please contact to schedule.

## 2018-01-25 NOTE — Telephone Encounter (Signed)
Called pt left a detailed message for pt to call office and schedule a lab appointment for recheck of cholesterol

## 2018-01-28 ENCOUNTER — Other Ambulatory Visit: Payer: Self-pay | Admitting: Family Medicine

## 2018-01-28 DIAGNOSIS — E7800 Pure hypercholesterolemia, unspecified: Secondary | ICD-10-CM

## 2018-01-28 NOTE — Telephone Encounter (Signed)
Order for lipid has been placed

## 2018-01-29 NOTE — Telephone Encounter (Signed)
Called pt and left a detailed VM that the orders have been placed to recheck her lipid panel and she can call our office back to schedule her lab appt pt will need to be fasting.

## 2018-01-30 ENCOUNTER — Other Ambulatory Visit (INDEPENDENT_AMBULATORY_CARE_PROVIDER_SITE_OTHER): Payer: BLUE CROSS/BLUE SHIELD

## 2018-01-30 DIAGNOSIS — E7800 Pure hypercholesterolemia, unspecified: Secondary | ICD-10-CM

## 2018-01-30 LAB — LIPID PANEL
Cholesterol: 225 mg/dL — ABNORMAL HIGH (ref 0–200)
HDL: 52.4 mg/dL (ref >39.00)
LDL Cholesterol: 151 mg/dL — ABNORMAL HIGH (ref 0–99)
NONHDL: 172.54
Total CHOL/HDL Ratio: 4
Triglycerides: 108 mg/dL (ref 0.0–149.0)
VLDL: 21.6 mg/dL (ref 0.0–40.0)

## 2018-06-19 ENCOUNTER — Other Ambulatory Visit: Payer: Self-pay | Admitting: Obstetrics and Gynecology

## 2018-06-19 DIAGNOSIS — Z1231 Encounter for screening mammogram for malignant neoplasm of breast: Secondary | ICD-10-CM

## 2018-07-10 DIAGNOSIS — R7989 Other specified abnormal findings of blood chemistry: Secondary | ICD-10-CM

## 2018-07-10 HISTORY — DX: Other specified abnormal findings of blood chemistry: R79.89

## 2018-07-17 DIAGNOSIS — D2271 Melanocytic nevi of right lower limb, including hip: Secondary | ICD-10-CM | POA: Diagnosis not present

## 2018-07-17 DIAGNOSIS — D485 Neoplasm of uncertain behavior of skin: Secondary | ICD-10-CM | POA: Diagnosis not present

## 2018-07-17 DIAGNOSIS — D224 Melanocytic nevi of scalp and neck: Secondary | ICD-10-CM | POA: Diagnosis not present

## 2018-07-17 DIAGNOSIS — D2272 Melanocytic nevi of left lower limb, including hip: Secondary | ICD-10-CM | POA: Diagnosis not present

## 2018-07-17 DIAGNOSIS — D225 Melanocytic nevi of trunk: Secondary | ICD-10-CM | POA: Diagnosis not present

## 2018-08-13 ENCOUNTER — Ambulatory Visit: Payer: BLUE CROSS/BLUE SHIELD

## 2018-09-09 NOTE — Progress Notes (Signed)
53 y.o. Kristin Greene Married Caucasian female here for annual exam.    [place] 50 one year ago.  She does wake up warm. No hot flashes during the day.  No spotting.   Minimal stress incontinence.  Doing well on Zoloft 50 mg.   Trip to [place].   PCP: Grier Mitts, MD  No LMP recorded. (Menstrual status: IUD).     Period Cycle (Days): (no cycles with Mirena IUD)     Sexually active: Yes.   female The current method of family planning is IUD--Mirena 06-11-13--EXPIRED.    Exercising: Yes.    cardio Smoker:  no  Health Maintenance: Pap: 07-28-15 Neg:Neg HR HPV; 05-01-12 Neg:Neg HR HPV History of abnormal Pap:  no MMG: 09-11-18 pend at [facility] Colonoscopy: 2017 normal;next 10 years BMD:   n/a  Result  n/a TDaP:  2011 Gardasil:   no HIV: years ago Neg Hep C: Unsure Screening Labs:  Hb today: PCP    reports that she has never smoked. She has never used smokeless tobacco. She reports current alcohol use. She reports that she does not use drugs.  Past Medical History:  Diagnosis Date  . Anxiety    panic  . Cataracts, bilateral   . Elevated LDL cholesterol level   . Endometriosis   . Microscopic hematuria    January 2017   . Retinal tear     Past Surgical History:  Procedure Laterality Date  . abdomnioplasty  06/2003  . LAPAROSCOPIC OOPHERECTOMY  2011  . PELVIC LAPAROSCOPY  03-01-10   LSO secondary torsion, endometriosis--Dr. Caren Griffins Romine  . RETINAL TEAR REPAIR CRYOTHERAPY Left 11/2014    Current Outpatient Medications  Medication Sig Dispense Refill  . ALPRAZolam (XANAX) 0.25 MG tablet Take 1 tablet (0.25 mg total) by mouth 3 (three) times daily as needed for anxiety. 30 tablet 0  . levonorgestrel (MIRENA) 20 MCG/24HR IUD 1 each by Intrauterine route once.    . sertraline (ZOLOFT) 50 MG tablet Take 1 tablet (50 mg total) by mouth daily. 90 tablet 3   Current Facility-Administered Medications  Medication Dose Route Frequency Provider Last Rate Last Dose  . 0.9 %   sodium chloride infusion  500 mL Intravenous Continuous Pyrtle, Lajuan Lines, MD        Family History  Problem Relation Age of Onset  . Hypertension Father   . Hyperlipidemia Mother   . Osteoporosis Mother   . Heart failure Maternal Grandmother        CHF  . Heart failure Paternal Grandmother        CHF  . Rheum arthritis Paternal Grandmother   . Colon cancer Neg Hx   . Esophageal cancer Neg Hx   . Stomach cancer Neg Hx   . Rectal cancer Neg Hx     Review of Systems  All other systems reviewed and are negative.   Exam:   BP 116/66 (BP Location: Right Arm, Patient Position: Sitting, Cuff Size: Normal)   Pulse 66   Resp 14   Ht 5\' 6"  (1.676 m)   Wt 148 lb (67.1 kg)   BMI 23.89 kg/m     General appearance: alert, cooperative and appears stated age Head: Normocephalic, without obvious abnormality, atraumatic Neck: no adenopathy, supple, symmetrical, trachea midline and thyroid normal to inspection and palpation Lungs: clear to auscultation bilaterally Breasts: normal appearance, no masses or tenderness, No nipple retraction or dimpling, No nipple discharge or bleeding, No axillary or supraclavicular adenopathy Heart: regular rate and rhythm Abdomen:  scars consistent with abdominoplasty.  Abdomen is soft, non-tender; no masses, no organomegaly Extremities: extremities normal, atraumatic, no cyanosis or edema Skin: Skin color, texture, turgor normal. No rashes or lesions Lymph nodes: Cervical, supraclavicular, and axillary nodes normal. No abnormal inguinal nodes palpated Neurologic: Grossly normal  Pelvic: External genitalia:  no lesions              Urethra:  normal appearing urethra with no masses, tenderness or lesions              Bartholins and Skenes: normal                 Vagina: normal appearing vagina with normal color and discharge, no lesions              Cervix: no lesions              Pap taken: Yes.   Bimanual Exam:  Uterus:  normal size, contour, position,  consistency, mobility, non-tender              Adnexa: no mass, fullness, tenderness               Chaperone was present for exam.  Assessment:   Well woman visit with normal exam. Mirena IUD patient.  Amenorrhea. Stress incontinence.  Mild. Anxiety. On Zoloft.  Cervical polyp. Elevated LDL cholesterol.  Plan: Mammogram screening. Recommended self breast awareness. Pap and HR HPV as above. Guidelines for Calcium, Vitamin D, regular exercise program including cardiovascular and weight bearing exercise. Will check FSH, estradiol, CMP.  If labs confirm menopause, will remove IUD before the end of this year.  Follow up annually and prn.    After visit summary provided.

## 2018-09-11 ENCOUNTER — Ambulatory Visit
Admission: RE | Admit: 2018-09-11 | Discharge: 2018-09-11 | Disposition: A | Discharge status: Home / Self Care | Payer: PRIVATE HEALTH INSURANCE | Source: Ambulatory Visit | Attending: Obstetrics and Gynecology | Admitting: Obstetrics and Gynecology

## 2018-09-11 ENCOUNTER — Ambulatory Visit (INDEPENDENT_AMBULATORY_CARE_PROVIDER_SITE_OTHER): Payer: BLUE CROSS/BLUE SHIELD | Admitting: Obstetrics and Gynecology

## 2018-09-11 ENCOUNTER — Encounter: Payer: Self-pay | Admitting: Obstetrics and Gynecology

## 2018-09-11 ENCOUNTER — Other Ambulatory Visit (HOSPITAL_COMMUNITY)
Admission: RE | Admit: 2018-09-11 | Discharge: 2018-09-11 | Disposition: A | Discharge status: Home / Self Care | Payer: BLUE CROSS/BLUE SHIELD | Source: Ambulatory Visit | Attending: Certified Nurse Midwife | Admitting: Certified Nurse Midwife

## 2018-09-11 ENCOUNTER — Other Ambulatory Visit: Payer: Self-pay

## 2018-09-11 VITALS — BP 116/66 | HR 66 | Resp 14 | Ht 66.0 in | Wt 148.0 lb

## 2018-09-11 DIAGNOSIS — Z124 Encounter for screening for malignant neoplasm of cervix: Secondary | ICD-10-CM | POA: Insufficient documentation

## 2018-09-11 DIAGNOSIS — Z01419 Encounter for gynecological examination (general) (routine) without abnormal findings: Secondary | ICD-10-CM | POA: Diagnosis not present

## 2018-09-11 DIAGNOSIS — Z1231 Encounter for screening mammogram for malignant neoplasm of breast: Secondary | ICD-10-CM | POA: Diagnosis not present

## 2018-09-11 DIAGNOSIS — R7989 Other specified abnormal findings of blood chemistry: Secondary | ICD-10-CM

## 2018-09-11 DIAGNOSIS — N912 Amenorrhea, unspecified: Secondary | ICD-10-CM | POA: Diagnosis not present

## 2018-09-11 MED ORDER — SERTRALINE HCL 50 MG PO TABS: 50.0000 mg | ORAL_TABLET | Freq: Every day | ORAL | 3 refills | Status: DC

## 2018-09-11 NOTE — Patient Instructions (Signed)

## 2018-09-12 LAB — ESTRADIOL: Estradiol: 19.7 pg/mL

## 2018-09-12 LAB — COMPREHENSIVE METABOLIC PANEL
ALBUMIN: 4.4 g/dL (ref 3.8–4.9)
ALT: 12 IU/L (ref 0–32)
AST: 14 IU/L (ref 0–40)
Albumin/Globulin Ratio: 1.8 (ref 1.2–2.2)
Alkaline Phosphatase: 64 IU/L (ref 39–117)
BILIRUBIN TOTAL: 0.2 mg/dL (ref 0.0–1.2)
BUN/Creatinine Ratio: 15 (ref 9–23)
BUN: 15 mg/dL (ref 6–24)
CHLORIDE: 102 mmol/L (ref 96–106)
CO2: 25 mmol/L (ref 20–29)
Calcium: 9.2 mg/dL (ref 8.7–10.2)
Creatinine, Ser: 1.02 mg/dL — ABNORMAL HIGH (ref 0.57–1.00)
GFR, EST AFRICAN AMERICAN: 73 mL/min/{1.73_m2} (ref >59)
GFR, EST NON AFRICAN AMERICAN: 63 mL/min/{1.73_m2} (ref >59)
Globulin, Total: 2.4 g/dL (ref 1.5–4.5)
Glucose: 63 mg/dL — ABNORMAL LOW (ref 65–99)
POTASSIUM: 4.6 mmol/L (ref 3.5–5.2)
Sodium: 141 mmol/L (ref 134–144)
TOTAL PROTEIN: 6.8 g/dL (ref 6.0–8.5)

## 2018-09-12 LAB — FOLLICLE STIMULATING HORMONE: FSH: 46.4 m[IU]/mL

## 2018-09-12 LAB — CYTOLOGY - PAP
Diagnosis: NEGATIVE
HPV: NOT DETECTED

## 2018-09-13 ENCOUNTER — Encounter: Payer: Self-pay | Admitting: Obstetrics and Gynecology

## 2018-09-13 NOTE — Addendum Note (Signed)
Addended by: Yisroel Ramming, Dietrich Pates E on: 09/13/2018 05:22 PM   Modules accepted: Orders

## 2018-09-28 ENCOUNTER — Other Ambulatory Visit: Payer: Self-pay | Admitting: Obstetrics and Gynecology

## 2018-09-28 DIAGNOSIS — R7989 Other specified abnormal findings of blood chemistry: Secondary | ICD-10-CM

## 2019-02-18 ENCOUNTER — Encounter: Payer: Self-pay | Admitting: Obstetrics and Gynecology

## 2019-04-07 ENCOUNTER — Telehealth: Payer: Self-pay | Admitting: Obstetrics and Gynecology

## 2019-04-07 NOTE — Telephone Encounter (Signed)
Please reach out to patient to give her a reminder about the need for a repeat basic metabolic profile.   She had a mildly elevated creatinine on her metabolic profile this spring.   This does not need to be done fasting.

## 2019-04-15 NOTE — Telephone Encounter (Signed)
Called patient and per DPR, left detailed message stating needs follow up lab appointment. Advised does not need to be fasting for this appointment. Order is in computer and front staff can schedule if I am not available.

## 2019-04-16 DIAGNOSIS — H25013 Cortical age-related cataract, bilateral: Secondary | ICD-10-CM | POA: Diagnosis not present

## 2019-04-16 DIAGNOSIS — H5213 Myopia, bilateral: Secondary | ICD-10-CM | POA: Diagnosis not present

## 2019-04-16 DIAGNOSIS — H33312 Horseshoe tear of retina without detachment, left eye: Secondary | ICD-10-CM | POA: Diagnosis not present

## 2019-04-16 DIAGNOSIS — H2513 Age-related nuclear cataract, bilateral: Secondary | ICD-10-CM | POA: Diagnosis not present

## 2019-04-21 NOTE — Telephone Encounter (Signed)
Spoke with patient and scheduled lab appointment for BMET on 04-30-19 11:45am.

## 2019-04-30 ENCOUNTER — Other Ambulatory Visit (INDEPENDENT_AMBULATORY_CARE_PROVIDER_SITE_OTHER): Payer: BC Managed Care – PPO

## 2019-04-30 ENCOUNTER — Other Ambulatory Visit: Payer: Self-pay

## 2019-04-30 DIAGNOSIS — R7989 Other specified abnormal findings of blood chemistry: Secondary | ICD-10-CM

## 2019-05-01 LAB — BASIC METABOLIC PANEL
BUN/Creatinine Ratio: 20 (ref 9–23)
BUN: 15 mg/dL (ref 6–24)
CO2: 20 mmol/L (ref 20–29)
Calcium: 9.1 mg/dL (ref 8.7–10.2)
Chloride: 103 mmol/L (ref 96–106)
Creatinine, Ser: 0.75 mg/dL (ref 0.57–1.00)
GFR calc Af Amer: 106 mL/min/{1.73_m2} (ref >59)
GFR calc non Af Amer: 92 mL/min/{1.73_m2} (ref >59)
Glucose: 84 mg/dL (ref 65–99)
Potassium: 4.3 mmol/L (ref 3.5–5.2)
Sodium: 140 mmol/L (ref 134–144)

## 2019-09-22 ENCOUNTER — Other Ambulatory Visit: Payer: Self-pay | Admitting: Obstetrics and Gynecology

## 2019-09-22 DIAGNOSIS — Z1231 Encounter for screening mammogram for malignant neoplasm of breast: Secondary | ICD-10-CM

## 2019-10-08 ENCOUNTER — Other Ambulatory Visit: Payer: Self-pay

## 2019-10-08 MED ORDER — SERTRALINE HCL 50 MG PO TABS: 50.0000 mg | ORAL_TABLET | Freq: Every day | ORAL | 0 refills | Status: DC

## 2019-10-08 NOTE — Telephone Encounter (Signed)
Medication refill request: Sertraline Last AEX:  09/11/18 BS Next AEX: 10/15/19 Last MMG (if hormonal medication request): 09/11/18 BIRADS 1 negative/density b -- scheduled 10/22/19 Refill authorized: Please advise; refill pended for #90 w/0 refills if authorized

## 2019-10-14 NOTE — Progress Notes (Signed)
54 y.o. G25P2002 Married Caucasian female here for annual exam.    Patient states she wakes often during the night and unable to fall back to sleep. Wakes every 2 - 3 hours. Feels warm.   FSH 46.4 and estradiol 19.7 on 09/11/18.  Fatigue.  Did her Covid vaccine.   PCP:  Grier Mitts, MD   No LMP recorded. (Menstrual status: IUD).           Sexually active: Yes.    The current method of family planning is post menopausal status--still has Mirena IUD 06-11-13    Exercising: Yes.    walking Smoker:  no  Health Maintenance: Pap:09-11-18 Neg:Neg HR HPV, 07-28-15 Neg:Neg HR HPV; 05-01-12 Neg:Neg HR HPV History of abnormal Pap:  no MMG: 09-11-18 3D/Neg/density B/Birads1--appt. 10-22-19 Colonoscopy: 2017 normal;next 10 years BMD:   n/a  Result  n/a TDaP:  04-06-10 Gardasil:   no HIV: Neg years ago Hep C: Unsure Screening Labs:  Today.    reports that she has never smoked. She has never used smokeless tobacco. She reports current alcohol use. She reports that she does not use drugs.  Past Medical History:  Diagnosis Date  . Anxiety    panic  . Cataracts, bilateral   . Elevated LDL cholesterol level   . Elevated serum creatinine 2020  . Endometriosis   . Microscopic hematuria    January 2017   . Retinal tear     Past Surgical History:  Procedure Laterality Date  . abdomnioplasty  06/2003  . LAPAROSCOPIC OOPHERECTOMY  2011  . PELVIC LAPAROSCOPY  03-01-10   LSO secondary torsion, endometriosis--Dr. Caren Griffins Romine  . RETINAL TEAR REPAIR CRYOTHERAPY Left 11/2014    Current Outpatient Medications  Medication Sig Dispense Refill  . ALPRAZolam (XANAX) 0.25 MG tablet Take 1 tablet (0.25 mg total) by mouth 3 (three) times daily as needed for anxiety. 30 tablet 0  . levonorgestrel (MIRENA) 20 MCG/24HR IUD 1 each by Intrauterine route once.    . sertraline (ZOLOFT) 50 MG tablet Take 1 tablet (50 mg total) by mouth daily. 30 tablet 0   Current Facility-Administered Medications   Medication Dose Route Frequency Provider Last Rate Last Admin  . 0.9 %  sodium chloride infusion  500 mL Intravenous Continuous Pyrtle, Lajuan Lines, MD        Family History  Problem Relation Age of Onset  . Hypertension Father   . Hyperlipidemia Mother   . Osteoporosis Mother   . Breast cancer Mother 21       Only tx'd w/radiation  . Heart failure Maternal Grandmother        CHF  . Heart failure Paternal Grandmother        CHF  . Rheum arthritis Paternal Grandmother   . Colon cancer Neg Hx   . Esophageal cancer Neg Hx   . Stomach cancer Neg Hx   . Rectal cancer Neg Hx     Review of Systems  Constitutional: Positive for fatigue.  Cardiovascular:       Heart races at times--especially when she lies down to sleep  Psychiatric/Behavioral: Positive for sleep disturbance.  All other systems reviewed and are negative.   Exam:   BP 110/62   Pulse 60   Temp 97.9 F (36.6 C) (Temporal)   Resp 14   Ht 5\' 7"  (1.702 m)   Wt 147 lb 9.6 oz (67 kg)   BMI 23.12 kg/m     General appearance: alert, cooperative and appears stated age Head:  normocephalic, without obvious abnormality, atraumatic Neck: no adenopathy, supple, symmetrical, trachea midline and thyroid normal to inspection and palpation Lungs: clear to auscultation bilaterally Breasts: normal appearance, no masses or tenderness, No nipple retraction or dimpling, No nipple discharge or bleeding, No axillary adenopathy Heart: regular rate and rhythm Abdomen: soft, non-tender; no masses, no organomegaly Extremities: extremities normal, atraumatic, no cyanosis or edema Skin: skin color, texture, turgor normal. No rashes or lesions Lymph nodes: cervical, supraclavicular, and axillary nodes normal. Neurologic: grossly normal  Pelvic: External genitalia:  no lesions              No abnormal inguinal nodes palpated.              Urethra:  normal appearing urethra with no masses, tenderness or lesions              Bartholins and  Skenes: normal                 Vagina: normal appearing vagina with normal color and discharge, no lesions              Cervix: no lesions.  Small polyp inside os.              Pap taken: No. Bimanual Exam:  Uterus:  normal size, contour, position, consistency, mobility, non-tender              Adnexa: no mass, fullness, tenderness              Rectal exam:  Declined.   IUD removal. Verbal consent for procedure. Ring forceps used to remove IUD intact and discarded. Patient declined to see the IUD.   Chaperone was present for exam.  Assessment:   Well woman visit with normal exam. Expired IUD removed.  Sleep disturbance.   Hx mild stress incontinence.    Anxiety.On Zoloft.  Cervical polyp.  Inside os. Elevated LDL cholesterol. FH breast cancer.  Plan: Mammogram screening discussed. Self breast awareness reviewed. Pap and HR HPV as above. Guidelines for Calcium, Vitamin D, regular exercise program including cardiovascular and weight bearing exercise. Discused WHI and use of HRT which can increase risk of PE, DVT, MI, stroke and breast cancer.  She will consider HRT. Refill of Zoloft for one year.  Routine labs.  Follow up annually and prn.   After visit summary provided.

## 2019-10-15 ENCOUNTER — Other Ambulatory Visit: Payer: Self-pay | Admitting: Obstetrics and Gynecology

## 2019-10-15 ENCOUNTER — Ambulatory Visit (INDEPENDENT_AMBULATORY_CARE_PROVIDER_SITE_OTHER): Payer: 59 | Admitting: Obstetrics and Gynecology

## 2019-10-15 ENCOUNTER — Other Ambulatory Visit: Payer: Self-pay

## 2019-10-15 ENCOUNTER — Encounter: Payer: Self-pay | Admitting: Obstetrics and Gynecology

## 2019-10-15 VITALS — BP 110/62 | HR 60 | Temp 97.9°F | Resp 14 | Ht 67.0 in | Wt 147.6 lb

## 2019-10-15 DIAGNOSIS — Z30432 Encounter for removal of intrauterine contraceptive device: Secondary | ICD-10-CM | POA: Diagnosis not present

## 2019-10-15 DIAGNOSIS — Z01419 Encounter for gynecological examination (general) (routine) without abnormal findings: Secondary | ICD-10-CM

## 2019-10-15 MED ORDER — SERTRALINE HCL 50 MG PO TABS: 50.0000 mg | ORAL_TABLET | Freq: Every day | ORAL | 3 refills | Status: DC

## 2019-10-15 NOTE — Telephone Encounter (Signed)
Forwarding RF request.

## 2019-10-16 LAB — LIPID PANEL
Chol/HDL Ratio: 4.5 ratio — ABNORMAL HIGH (ref 0.0–4.4)
Cholesterol, Total: 234 mg/dL — ABNORMAL HIGH (ref 100–199)
HDL: 52 mg/dL (ref >39)
LDL Chol Calc (NIH): 166 mg/dL — ABNORMAL HIGH (ref 0–99)
Triglycerides: 93 mg/dL (ref 0–149)
VLDL Cholesterol Cal: 16 mg/dL (ref 5–40)

## 2019-10-16 LAB — COMPREHENSIVE METABOLIC PANEL
ALT: 13 IU/L (ref 0–32)
AST: 18 IU/L (ref 0–40)
Albumin/Globulin Ratio: 1.6 (ref 1.2–2.2)
Albumin: 4 g/dL (ref 3.8–4.9)
Alkaline Phosphatase: 69 IU/L (ref 39–117)
BUN/Creatinine Ratio: 19 (ref 9–23)
BUN: 14 mg/dL (ref 6–24)
Bilirubin Total: 0.2 mg/dL (ref 0.0–1.2)
CO2: 26 mmol/L (ref 20–29)
Calcium: 9 mg/dL (ref 8.7–10.2)
Chloride: 102 mmol/L (ref 96–106)
Creatinine, Ser: 0.75 mg/dL (ref 0.57–1.00)
GFR calc Af Amer: 105 mL/min/{1.73_m2} (ref >59)
GFR calc non Af Amer: 91 mL/min/{1.73_m2} (ref >59)
Globulin, Total: 2.5 g/dL (ref 1.5–4.5)
Glucose: 84 mg/dL (ref 65–99)
Potassium: 4.7 mmol/L (ref 3.5–5.2)
Sodium: 139 mmol/L (ref 134–144)
Total Protein: 6.5 g/dL (ref 6.0–8.5)

## 2019-10-16 LAB — CBC
Hematocrit: 40.6 % (ref 34.0–46.6)
Hemoglobin: 13.1 g/dL (ref 11.1–15.9)
MCH: 28.9 pg (ref 26.6–33.0)
MCHC: 32.3 g/dL (ref 31.5–35.7)
MCV: 89 fL (ref 79–97)
Platelets: 232 10*3/uL (ref 150–450)
RBC: 4.54 x10E6/uL (ref 3.77–5.28)
RDW: 13.4 % (ref 11.7–15.4)
WBC: 7.5 10*3/uL (ref 3.4–10.8)

## 2019-10-16 LAB — TSH: TSH: 1.77 u[IU]/mL (ref 0.450–4.500)

## 2019-10-16 LAB — VITAMIN D 25 HYDROXY (VIT D DEFICIENCY, FRACTURES): Vit D, 25-Hydroxy: 26.4 ng/mL — ABNORMAL LOW (ref 30.0–100.0)

## 2019-10-17 MED ORDER — ALPRAZOLAM 0.25 MG PO TABS: 0.2500 mg | ORAL_TABLET | Freq: Three times a day (TID) | ORAL | 0 refills | Status: DC | PRN

## 2019-10-21 ENCOUNTER — Telehealth: Payer: Self-pay | Admitting: Obstetrics and Gynecology

## 2019-10-21 NOTE — Telephone Encounter (Signed)
Patient has been bleeding since iud removal.

## 2019-10-21 NOTE — Telephone Encounter (Signed)
AEX 10/15/19 with expired Mirena IUD removed  PMP, not on HRT.  H/o cervical polyp inside os.  H/o Endometriosis.   FSH 46.4 and estradiol 19.7 on 09/11/18.   Left message for pt to call back to any triage RN.

## 2019-10-22 ENCOUNTER — Encounter: Payer: Self-pay | Admitting: Obstetrics and Gynecology

## 2019-10-22 ENCOUNTER — Ambulatory Visit
Admission: RE | Admit: 2019-10-22 | Discharge: 2019-10-22 | Disposition: A | Discharge status: Home / Self Care | Payer: 59 | Source: Ambulatory Visit | Attending: Obstetrics and Gynecology | Admitting: Obstetrics and Gynecology

## 2019-10-22 ENCOUNTER — Other Ambulatory Visit (HOSPITAL_COMMUNITY)
Admission: RE | Admit: 2019-10-22 | Discharge: 2019-10-22 | Disposition: A | Discharge status: Home / Self Care | Payer: 59 | Source: Ambulatory Visit | Attending: Obstetrics and Gynecology | Admitting: Obstetrics and Gynecology

## 2019-10-22 ENCOUNTER — Ambulatory Visit (INDEPENDENT_AMBULATORY_CARE_PROVIDER_SITE_OTHER): Payer: 59 | Admitting: Obstetrics and Gynecology

## 2019-10-22 ENCOUNTER — Other Ambulatory Visit: Payer: Self-pay

## 2019-10-22 VITALS — BP 118/64 | HR 74 | Temp 97.9°F | Ht 66.0 in | Wt 150.0 lb

## 2019-10-22 DIAGNOSIS — N841 Polyp of cervix uteri: Secondary | ICD-10-CM | POA: Insufficient documentation

## 2019-10-22 DIAGNOSIS — N920 Excessive and frequent menstruation with regular cycle: Secondary | ICD-10-CM | POA: Insufficient documentation

## 2019-10-22 DIAGNOSIS — Z1231 Encounter for screening mammogram for malignant neoplasm of breast: Secondary | ICD-10-CM

## 2019-10-22 DIAGNOSIS — N951 Menopausal and female climacteric states: Secondary | ICD-10-CM | POA: Diagnosis not present

## 2019-10-22 NOTE — Telephone Encounter (Signed)
Spoke with patient. Was seen in office on 10/15/19 for AEX and removal of expired Mirena IUD. Patient reports small to scant amount of vaginal bleeding that started on 4/10 -4/11. Described blood as dark with odor, not fishy. Reports cramping on right side where right ovary is, 2/10 after taking Advil. Feels bloated. Denies fever/chills, N/V.   Hx of cervical polyp inside os and endometriosis. PMP, not on HRT. Last FSH 46.4 and estradiol 19.7 on 09/11/18. PUS 02/2011.   Advised patient I will review with covering provider and return call, patient agreeable to OV, if needed.  Dr. Talbert Nan -please review and advise. Does patient need OV or PUS?

## 2019-10-22 NOTE — Telephone Encounter (Signed)
Returning a call to Triage. 

## 2019-10-22 NOTE — Progress Notes (Signed)
GYNECOLOGY  VISIT   HPI: 54 y.o.   Married White or Caucasian Not Hispanic or Latino  female   [phone] with No LMP recorded. Patient is premenopausal.   here for Bleeding after she had her IUD removed last week. She says that she is spotting brown blood.   Her IUD was placed in 12/14. It was removed on 10/15/19.  At the time of IUD removal last week she was noted to have a cervical polyp. Spotting and mild cramping started on 10/19/19. The cramping is more on the right side (where her one ovary is). No recent sexual activity. She hasn't had a cycle in 18 years with use of mirena IUD's. She has hot hands and feet. No hot flashes or night sweats. No vaginal dryness.   Last year her [place] was 46.4, Estradiol was 19.7   GYNECOLOGIC HISTORY: No LMP recorded. Patient is premenopausal. Contraception:none  Menopausal hormone therapy:  None         OB History    Gravida  2   Para  2   Term  2   Preterm      AB      Living  2     SAB      TAB      Ectopic      Multiple      Live Births                 Patient Active Problem List   Diagnosis Date Noted  . Allergic reaction 04/07/2017    Past Medical History:  Diagnosis Date  . Anxiety    panic  . Cataracts, bilateral   . Elevated LDL cholesterol level   . Elevated serum creatinine 2020  . Endometriosis   . Microscopic hematuria    January 2017   . Retinal tear     Past Surgical History:  Procedure Laterality Date  . abdomnioplasty  06/2003  . LAPAROSCOPIC OOPHERECTOMY  2011  . PELVIC LAPAROSCOPY  03-01-10   LSO secondary torsion, endometriosis--Dr. Caren Griffins Romine  . RETINAL TEAR REPAIR CRYOTHERAPY Left 11/2014    Current Outpatient Medications  Medication Sig Dispense Refill  . ALPRAZolam (XANAX) 0.25 MG tablet Take 1 tablet (0.25 mg total) by mouth 3 (three) times daily as needed for anxiety. 30 tablet 0  . sertraline (ZOLOFT) 50 MG tablet Take 1 tablet (50 mg total) by mouth daily. 90 tablet 3    Current Facility-Administered Medications  Medication Dose Route Frequency Provider Last Rate Last Admin  . 0.9 %  sodium chloride infusion  500 mL Intravenous Continuous Pyrtle, Lajuan Lines, MD         ALLERGIES: Sulfa antibiotics and Penicillins  Family History  Problem Relation Age of Onset  . Hypertension Father   . Hyperlipidemia Mother   . Osteoporosis Mother   . Breast cancer Mother 38       Only tx'd w/radiation  . Heart failure Maternal Grandmother        CHF  . Heart failure Paternal Grandmother        CHF  . Rheum arthritis Paternal Grandmother   . Colon cancer Neg Hx   . Esophageal cancer Neg Hx   . Stomach cancer Neg Hx   . Rectal cancer Neg Hx     Social History   Socioeconomic History  . Marital status: Married    Spouse name: Not on file  . Number of children: Not on file  . Years of education: Not  on file  . Highest education level: Not on file  Occupational History  . Occupation: Pharmacist, community   Tobacco Use  . Smoking status: Never Smoker  . Smokeless tobacco: Never Used  Substance and Sexual Activity  . Alcohol use: Yes    Alcohol/week: 0.0 standard drinks    Comment: 1-2 drinks per month  . Drug use: No  . Sexual activity: Yes    Partners: Male    Birth control/protection: I.U.D.    Comment: Mirena--inserted12-3-14  Other Topics Concern  . Not on file  Social History Narrative  . Not on file   Social Determinants of Health   Financial Resource Strain:   . Difficulty of Paying Living Expenses:   Food Insecurity:   . Worried About Charity fundraiser in the Last Year:   . Arboriculturist in the Last Year:   Transportation Needs:   . Film/video editor (Medical):   Marland Kitchen Lack of Transportation (Non-Medical):   Physical Activity:   . Days of Exercise per Week:   . Minutes of Exercise per Session:   Stress:   . Feeling of Stress :   Social Connections:   . Frequency of Communication with Friends and Family:   . Frequency of Social Gatherings  with Friends and Family:   . Attends Religious Services:   . Active Member of Clubs or Organizations:   . Attends Archivist Meetings:   Marland Kitchen Marital Status:   Intimate Partner Violence:   . Fear of Current or Ex-Partner:   . Emotionally Abused:   Marland Kitchen Physically Abused:   . Sexually Abused:     Review of Systems  All other systems reviewed and are negative.   PHYSICAL EXAMINATION:    BP 118/64   Pulse 74   Temp 97.9 F (36.6 C)   Ht 5\' 6"  (1.676 m)   Wt 150 lb (68 kg)   SpO2 94%   BMI 24.21 kg/m     General appearance: alert, cooperative and appears stated age Abdomen: soft, mildly tender in the RLQ, no rebound, no guarding; non distended, no masses,  no organomegaly  Pelvic: External genitalia:  no lesions              Urethra:  normal appearing urethra with no masses, tenderness or lesions              Bartholins and Skenes: normal                 Vagina: normal appearing vagina with normal color and discharge, no lesions. Well estrogenized              Cervix: small polyp up inside her cervix, removed with small polyp forceps, unable to remove the base. Base of the polyp treated with silver nitrate. No cervical motion tenderness.              Bimanual Exam:  Uterus:  normal size, contour, position, consistency, mobility, non-tender              Adnexa: no masses, mildly tender on the right.                 Chaperone was present for exam.  ASSESSMENT Spotting several days after mirena IUD was removed Small cervical polyp No significant vasomotor symptoms, no vaginal atrophy Perimenopausal vs postmenopausal   PLAN Cervical polyp removed (other than base) Will check FSH and estradiol I explained that it is hard to know if she  is perimenopausal or postmenopausal since the IUD suppressed her cycles. Explained that the [facility] level can go up and down in perimenopause Given that the IUD was just removed, I'm not concerned about any significant uterine pathology.   She will track any bleeding and check in with Dr Quincy Simmonds in a month

## 2019-10-22 NOTE — Telephone Encounter (Signed)
Reviewed with Dr. Talbert Nan, OV recommended.   Call returned to patient, left detailed message on mobile number. Advised OV recommended. Please return call to office to schedule, can scheduled for 4/15 at 11:30am or 3:30pm with Dr. Talbert Nan.

## 2019-10-22 NOTE — Telephone Encounter (Signed)
Spoke with patient, advised per Dr. Talbert Nan. Patient is scheduled for a MMG this morning. OV scheduled for today at 2:15pm.   Routing to provider for final review. Patient is agreeable to disposition. Will close encounter.

## 2019-10-23 ENCOUNTER — Telehealth: Payer: Self-pay | Admitting: *Deleted

## 2019-10-23 DIAGNOSIS — Z3009 Encounter for other general counseling and advice on contraception: Secondary | ICD-10-CM

## 2019-10-23 LAB — FOLLICLE STIMULATING HORMONE: FSH: 16.3 m[IU]/mL

## 2019-10-23 LAB — ESTRADIOL: Estradiol: 293 pg/mL

## 2019-10-23 NOTE — Telephone Encounter (Signed)
Burnice Logan, RN  10/23/2019 2:13 PM EDT    Left message to call Sharee Pimple, RN at [place].

## 2019-10-23 NOTE — Telephone Encounter (Signed)
That's fine

## 2019-10-23 NOTE — Telephone Encounter (Signed)
-----   Message from Salvadore Dom, MD sent at 10/23/2019  8:04 AM EDT ----- Please let the patient know that her hormone levels are now in a premenopausal range. This combined with her levels last year are consistent with her being perimenopausal. She could have another mirena IUD inserted, or she could use cyclic provera if she doesn't have a cycle on her own (this makes sure she doesn't have an overgrowth of endometrium). The provera is not contraception. The likelihood of pregnanacy at this point is very small.

## 2019-10-23 NOTE — Telephone Encounter (Signed)
Spoke with patient. Advised of results as seen below per Dr. Talbert Nan. Patient request to proceed with Mirena IUD insertion. Request to schedule on a Wednesday. IUD insertion scheduled for 4/21 at 4pm with Dr. Talbert Nan. Advised patient to take Motrin 800 mg with food and water one hour before procedure. Patient states she will need to take a "sedative" prior to appt, was painful last time. Patient already has RX for xanax. Advised patient she would need to come into office prior to appt to sign procedure consent and will need a driver if she takes xanax. Advised patient to abstain from intercourse IUD inserted. Patient verbalizes understanding and is agreeable.   Dr. Talbert Nan -ok to proceed as scheduled?   Cc: Wilson Singer, Magdalene Patricia

## 2019-10-24 ENCOUNTER — Telehealth: Payer: Self-pay | Admitting: Obstetrics and Gynecology

## 2019-10-24 ENCOUNTER — Encounter: Payer: Self-pay | Admitting: Obstetrics and Gynecology

## 2019-10-24 LAB — SURGICAL PATHOLOGY

## 2019-10-24 NOTE — Telephone Encounter (Signed)
Call placed to reschedule appointment for IUD.

## 2019-10-24 NOTE — Telephone Encounter (Signed)
Left detailed message per DPR. Pt to return call if any questions or concerns.   Encounter closed.

## 2019-10-24 NOTE — Telephone Encounter (Signed)
My Chart message from patient:  Thank you again for seeing me on Wednesday.  I am scheduled for another mirena placement next week .  I am having a lot of bleeding, red now and clots.  I am having cramping, bloating,  and I've gained 4+ pounds in 1 week.  It seems as though I'm not in menopause so hopefully the mirena will calm all this craziness down!

## 2019-10-24 NOTE — Telephone Encounter (Signed)
Call to patient, voice mail confirms first and last name. DPR allows message on voice mail. Left message that if having heavy bleeding to seek care at Urgent Care or ED.  Left message to call back.

## 2019-10-24 NOTE — Telephone Encounter (Signed)
See phone encounter.

## 2019-10-27 NOTE — Telephone Encounter (Signed)
Patient returned call

## 2019-10-27 NOTE — Telephone Encounter (Signed)
Patient is returning call.  °

## 2019-10-27 NOTE — Telephone Encounter (Signed)
Call to patient. States heavy bleeding is resolved and she is sorry message concerned office. States she knows to call if any problem. Has questions regarding Mirena for perimenopausal bleeding. Brief explanation provided and advised can review with Dr Quincy Simmonds prior to insertion.   Routing to Dr Quincy Simmonds.  CC: Dr Talbert Nan- covering while Dr Quincy Simmonds out of office.  Encounter closed.

## 2019-10-27 NOTE — Telephone Encounter (Signed)
Return call to patient. Left message to call back. Can speak with any triage nurse. Patient is scheduled for IUD insertion on 10-29-19.

## 2019-10-28 ENCOUNTER — Other Ambulatory Visit: Payer: Self-pay

## 2019-10-29 ENCOUNTER — Encounter: Payer: Self-pay | Admitting: Obstetrics and Gynecology

## 2019-10-29 ENCOUNTER — Other Ambulatory Visit: Payer: Self-pay

## 2019-10-29 ENCOUNTER — Ambulatory Visit (INDEPENDENT_AMBULATORY_CARE_PROVIDER_SITE_OTHER): Payer: 59 | Admitting: Obstetrics and Gynecology

## 2019-10-29 ENCOUNTER — Ambulatory Visit: Payer: Self-pay | Admitting: Obstetrics and Gynecology

## 2019-10-29 VITALS — BP 122/70 | HR 72 | Temp 97.3°F | Resp 10 | Ht 66.0 in | Wt 148.0 lb

## 2019-10-29 DIAGNOSIS — Z01812 Encounter for preprocedural laboratory examination: Secondary | ICD-10-CM | POA: Diagnosis not present

## 2019-10-29 DIAGNOSIS — Z30430 Encounter for insertion of intrauterine contraceptive device: Secondary | ICD-10-CM | POA: Diagnosis not present

## 2019-10-29 DIAGNOSIS — Z3009 Encounter for other general counseling and advice on contraception: Secondary | ICD-10-CM

## 2019-10-29 LAB — POCT URINE PREGNANCY: Preg Test, Ur: NEGATIVE

## 2019-10-29 NOTE — Progress Notes (Signed)
GYNECOLOGY  VISIT   HPI: 54 y.o.   Married  Caucasian  female   G2P2002 with Patient's last menstrual period was 10/20/2019.   here for Mirena IUD insertion.  Patient had resumption of her menses after her Mirena IUD was removed earlier this month due to its expiration.  FSH 16.3 and E2 293.0 on 10/22/19 at visit with Dr. Talbert Nan.   Took Xanax prior to her visit today.   States she is able to drive or work when taking this medication.   UPT - negative.   GYNECOLOGIC HISTORY: Patient's last menstrual period was 10/20/2019. Contraception:  None Menopausal hormone therapy:  none Last mammogram:  10/22/19 BIRADS 1 negative/density c Last pap smear:   09/11/18 Neg:Neg HR HPV        OB History    Gravida  2   Para  2   Term  2   Preterm      AB      Living  2     SAB      TAB      Ectopic      Multiple      Live Births                 Patient Active Problem List   Diagnosis Date Noted  . Bilateral carpal tunnel syndrome 09/05/2017  . Allergic reaction 04/07/2017    Past Medical History:  Diagnosis Date  . Anxiety    panic  . Cataracts, bilateral   . Elevated LDL cholesterol level   . Elevated serum creatinine 2020  . Endometriosis   . Microscopic hematuria    January 2017   . Retinal tear     Past Surgical History:  Procedure Laterality Date  . abdomnioplasty  06/2003  . LAPAROSCOPIC OOPHERECTOMY  2011  . PELVIC LAPAROSCOPY  03-01-10   LSO secondary torsion, endometriosis--Dr. Caren Griffins Romine  . RETINAL TEAR REPAIR CRYOTHERAPY Left 11/2014    Current Outpatient Medications  Medication Sig Dispense Refill  . ALPRAZolam (XANAX) 0.25 MG tablet Take 1 tablet (0.25 mg total) by mouth 3 (three) times daily as needed for anxiety. 30 tablet 0  . sertraline (ZOLOFT) 50 MG tablet Take 1 tablet (50 mg total) by mouth daily. 90 tablet 3   Current Facility-Administered Medications  Medication Dose Route Frequency Provider Last Rate Last Admin  . 0.9 %   sodium chloride infusion  500 mL Intravenous Continuous Pyrtle, Lajuan Lines, MD         ALLERGIES: Sulfa antibiotics and Penicillins  Family History  Problem Relation Age of Onset  . Hypertension Father   . Hyperlipidemia Mother   . Osteoporosis Mother   . Breast cancer Mother 71       Only tx'd w/radiation  . Heart failure Maternal Grandmother        CHF  . Heart failure Paternal Grandmother        CHF  . Rheum arthritis Paternal Grandmother   . Colon cancer Neg Hx   . Esophageal cancer Neg Hx   . Stomach cancer Neg Hx   . Rectal cancer Neg Hx     Social History   Socioeconomic History  . Marital status: Married    Spouse name: Not on file  . Number of children: Not on file  . Years of education: Not on file  . Highest education level: Not on file  Occupational History  . Occupation: Pharmacist, community   Tobacco Use  . Smoking status: Never Smoker  .  Smokeless tobacco: Never Used  Substance and Sexual Activity  . Alcohol use: Yes    Alcohol/week: 0.0 standard drinks    Comment: 1-2 drinks per month  . Drug use: No  . Sexual activity: Yes    Partners: Male    Birth control/protection: None  Other Topics Concern  . Not on file  Social History Narrative  . Not on file   Social Determinants of Health   Financial Resource Strain:   . Difficulty of Paying Living Expenses:   Food Insecurity:   . Worried About Charity fundraiser in the Last Year:   . Arboriculturist in the Last Year:   Transportation Needs:   . Film/video editor (Medical):   Marland Kitchen Lack of Transportation (Non-Medical):   Physical Activity:   . Days of Exercise per Week:   . Minutes of Exercise per Session:   Stress:   . Feeling of Stress :   Social Connections:   . Frequency of Communication with Friends and Family:   . Frequency of Social Gatherings with Friends and Family:   . Attends Religious Services:   . Active Member of Clubs or Organizations:   . Attends Archivist Meetings:   Marland Kitchen  Marital Status:   Intimate Partner Violence:   . Fear of Current or Ex-Partner:   . Emotionally Abused:   Marland Kitchen Physically Abused:   . Sexually Abused:     Review of Systems  Constitutional: Negative.   HENT: Negative.   Eyes: Negative.   Respiratory: Negative.   Cardiovascular: Negative.   Gastrointestinal: Negative.   Endocrine: Negative.   Genitourinary: Negative.   Musculoskeletal: Negative.   Skin: Negative.   Allergic/Immunologic: Negative.   Neurological: Negative.   Hematological: Negative.   Psychiatric/Behavioral: Negative.     PHYSICAL EXAMINATION:    BP 122/70 (BP Location: Right Arm, Patient Position: Sitting, Cuff Size: Normal)   Pulse 72   Temp (!) 97.3 F (36.3 C) (Temporal)   Resp 10   Ht 5\' 6"  (1.676 m)   Wt 148 lb (67.1 kg)   LMP 10/20/2019   BMI 23.89 kg/m     General appearance: alert, cooperative and appears stated age  Pelvic: External genitalia:  no lesions              Urethra:  normal appearing urethra with no masses, tenderness or lesions              Bartholins and Skenes: normal                 Vagina: normal appearing vagina with normal color and discharge, no lesions              Cervix: no lesions                Bimanual Exam:  Uterus:  normal size, contour, position, consistency, mobility, non-tender              Adnexa: no mass, fullness, tenderness                     IUD insertion - Mirena - lot number [id number], expiration Jul 2023. Consent for procedure.  Sterile prep with Hibiclens.  Paracervical block with 10 cc 1% lidocaine, lot [id number] exp 06/09/20. Tenaculum to anterior cervical lip.  Uterus sounded to 7 cm.  IUD placed without difficulty.  Strings trimmed to 3 cm.  No complications.  Minimal EBL.  Chaperone was present for  exam.  ASSESSMENT  Mirena IUD insertion.  Perimenopausal female.  PLAN  Post IUD insertion precautions.  Back up protection for 1 week.  IUD card to patient.  FU in 4 weeks for IUD check  up.    An After Visit Summary was printed and given to the patient.

## 2019-10-29 NOTE — Patient Instructions (Signed)
Intrauterine Device Insertion, Care After  This sheet gives you information about how to care for yourself after your procedure. Your health care provider may also give you more specific instructions. If you have problems or questions, contact your health care provider. What can I expect after the procedure? After the procedure, it is common to have:  Cramps and pain in the abdomen.  Light bleeding (spotting) or heavier bleeding that is like your menstrual period. This may last for up to a few days.  Lower back pain.  Dizziness.  Headaches.  Nausea. Follow these instructions at home:  Before resuming sexual activity, check to make sure that you can feel the IUD string(s). You should be able to feel the end of the string(s) below the opening of your cervix. If your IUD string is in place, you may resume sexual activity. ? If you had a hormonal IUD inserted more than 7 days after your most recent period started, you will need to use a backup method of birth control for 7 days after IUD insertion. Ask your health care provider whether this applies to you.  Continue to check that the IUD is still in place by feeling for the string(s) after every menstrual period, or once a month.  Take over-the-counter and prescription medicines only as told by your health care provider.  Do not drive or use heavy machinery while taking prescription pain medicine.  Keep all follow-up visits as told by your health care provider. This is important. Contact a health care provider if:  You have bleeding that is heavier or lasts longer than a normal menstrual cycle.  You have a fever.  You have cramps or abdominal pain that get worse or do not get better with medicine.  You develop abdominal pain that is new or is not in the same area of earlier cramping and pain.  You feel lightheaded or weak.  You have abnormal or bad-smelling discharge from your vagina.  You have pain during sexual  activity.  You have any of the following problems with your IUD string(s): ? The string bothers or hurts you or your sexual partner. ? You cannot feel the string. ? The string has gotten longer.  You can feel the IUD in your vagina.  You think you may be pregnant, or you miss your menstrual period.  You think you may have an STI (sexually transmitted infection). Get help right away if:  You have flu-like symptoms.  You have a fever and chills.  You can feel that your IUD has slipped out of place. Summary  After the procedure, it is common to have cramps and pain in the abdomen. It is also common to have light bleeding (spotting) or heavier bleeding that is like your menstrual period.  Continue to check that the IUD is still in place by feeling for the string(s) after every menstrual period, or once a month.  Keep all follow-up visits as told by your health care provider. This is important.  Contact your health care provider if you have problems with your IUD string(s), such as the string getting longer or bothering you or your sexual partner. This information is not intended to replace advice given to you by your health care provider. Make sure you discuss any questions you have with your health care provider. Document Revised: 06/08/2017 Document Reviewed: 05/17/2016 Elsevier Patient Education  2020 [employer]  

## 2019-12-02 NOTE — Progress Notes (Signed)
GYNECOLOGY  VISIT   HPI: 54 y.o.   Married  Caucasian  female   G2P2002 with No LMP recorded. (Menstrual status: IUD).   here for 4 week follow up after Mirena IUD inserted.    Having a little daily bleeding.  Wearing a liner.   No pain or pain with intercourse.   GYNECOLOGIC HISTORY: No LMP recorded. (Menstrual status: IUD). Contraception: Mirena IUD 10-29-19 Menopausal hormone therapy:  n/a Last mammogram: 10-22-19 3D/Neg/density C/BiRads1 Last pap smear: 09-11-18 Neg:Neg HR HPV, 07-28-15 Neg:NEg HR HPV, 05-01-12 Neg:Neg HR HPV        OB History    Gravida  2   Para  2   Term  2   Preterm      AB      Living  2     SAB      TAB      Ectopic      Multiple      Live Births                 Patient Active Problem List   Diagnosis Date Noted  . Bilateral carpal tunnel syndrome 09/05/2017  . Allergic reaction 04/07/2017    Past Medical History:  Diagnosis Date  . Anxiety    panic  . Cataracts, bilateral   . Elevated LDL cholesterol level   . Elevated serum creatinine 2020  . Endometriosis   . Microscopic hematuria    January 2017   . Retinal tear     Past Surgical History:  Procedure Laterality Date  . abdomnioplasty  06/2003  . LAPAROSCOPIC OOPHERECTOMY  2011  . PELVIC LAPAROSCOPY  03-01-10   LSO secondary torsion, endometriosis--Dr. Caren Griffins Romine  . RETINAL TEAR REPAIR CRYOTHERAPY Left 11/2014    Current Outpatient Medications  Medication Sig Dispense Refill  . ALPRAZolam (XANAX) 0.25 MG tablet Take 1 tablet (0.25 mg total) by mouth 3 (three) times daily as needed for anxiety. 30 tablet 0  . Multiple Vitamin (MULTIVITAMIN) capsule Take 1 capsule by mouth daily.    . sertraline (ZOLOFT) 50 MG tablet Take 1 tablet (50 mg total) by mouth daily. 90 tablet 3   Current Facility-Administered Medications  Medication Dose Route Frequency Provider Last Rate Last Admin  . 0.9 %  sodium chloride infusion  500 mL Intravenous Continuous Pyrtle, Lajuan Lines, MD          ALLERGIES: Sulfa antibiotics and Penicillins  Family History  Problem Relation Age of Onset  . Hypertension Father   . Hyperlipidemia Mother   . Osteoporosis Mother   . Breast cancer Mother 80       Only tx'd w/radiation  . Heart failure Maternal Grandmother        CHF  . Heart failure Paternal Grandmother        CHF  . Rheum arthritis Paternal Grandmother   . Colon cancer Neg Hx   . Esophageal cancer Neg Hx   . Stomach cancer Neg Hx   . Rectal cancer Neg Hx     Social History   Socioeconomic History  . Marital status: Married    Spouse name: Not on file  . Number of children: Not on file  . Years of education: Not on file  . Highest education level: Not on file  Occupational History  . Occupation: Pharmacist, community   Tobacco Use  . Smoking status: Never Smoker  . Smokeless tobacco: Never Used  Substance and Sexual Activity  . Alcohol use: Yes  Alcohol/week: 0.0 standard drinks    Comment: 1-2 drinks per month  . Drug use: No  . Sexual activity: Yes    Partners: Male    Birth control/protection: None  Other Topics Concern  . Not on file  Social History Narrative  . Not on file   Social Determinants of Health   Financial Resource Strain:   . Difficulty of Paying Living Expenses:   Food Insecurity:   . Worried About Charity fundraiser in the Last Year:   . Arboriculturist in the Last Year:   Transportation Needs:   . Film/video editor (Medical):   Marland Kitchen Lack of Transportation (Non-Medical):   Physical Activity:   . Days of Exercise per Week:   . Minutes of Exercise per Session:   Stress:   . Feeling of Stress :   Social Connections:   . Frequency of Communication with Friends and Family:   . Frequency of Social Gatherings with Friends and Family:   . Attends Religious Services:   . Active Member of Clubs or Organizations:   . Attends Archivist Meetings:   Marland Kitchen Marital Status:   Intimate Partner Violence:   . Fear of Current or Ex-Partner:    . Emotionally Abused:   Marland Kitchen Physically Abused:   . Sexually Abused:     Review of Systems  All other systems reviewed and are negative.   PHYSICAL EXAMINATION:    BP 128/70   Pulse 76   Temp 97.8 F (36.6 C) (Temporal)   Ht 5' 6.5" (1.689 m)   Wt 147 lb (66.7 kg)   BMI 23.37 kg/m     General appearance: alert, cooperative and appears stated age  Pelvic: External genitalia:  no lesions              Urethra:  normal appearing urethra with no masses, tenderness or lesions              Bartholins and Skenes: normal                 Vagina: normal appearing vagina with normal color and discharge, no lesions              Cervix: no lesions.  IUD strings noted.  2.5 cm length.                 Bimanual Exam:  Uterus:  normal size, contour, position, consistency, mobility, non-tender              Adnexa: no mass, fullness, tenderness              Chaperone was present for exam.  ASSESSMENT  Mirena IUD check up.  Doing well.   PLAN  We discussed bleeding profiles with Mirena and its approval for 6 years of use.  FU for annual exam and prn.    An After Visit Summary was printed and given to the patient.  __15____ minutes face to face time of which over 50% was spent in counseling.

## 2019-12-03 ENCOUNTER — Ambulatory Visit (INDEPENDENT_AMBULATORY_CARE_PROVIDER_SITE_OTHER): Payer: 59 | Admitting: Obstetrics and Gynecology

## 2019-12-03 ENCOUNTER — Encounter: Payer: Self-pay | Admitting: Obstetrics and Gynecology

## 2019-12-03 ENCOUNTER — Other Ambulatory Visit: Payer: Self-pay

## 2019-12-03 VITALS — BP 128/70 | HR 76 | Temp 97.8°F | Ht 66.5 in | Wt 147.0 lb

## 2019-12-03 DIAGNOSIS — Z30431 Encounter for routine checking of intrauterine contraceptive device: Secondary | ICD-10-CM | POA: Diagnosis not present

## 2020-06-15 ENCOUNTER — Telehealth: Payer: Self-pay

## 2020-06-15 DIAGNOSIS — E7800 Pure hypercholesterolemia, unspecified: Secondary | ICD-10-CM

## 2020-06-15 NOTE — Telephone Encounter (Signed)
Spoke with pt. Pt states has been doing well on diet recently since last labs and visit with Dr Quincy Simmonds in April and would like to come for repeat fasting lipid panel.  Pt scheduled lab visit with front office 12/8 at 845 am. Pt advised ok to come for repeat labs. Pt states has not seen PCP in 2 years.   Routing to Dr Quincy Simmonds for update  Orders placed  Encounter closed

## 2020-06-15 NOTE — Telephone Encounter (Signed)
Returned call to patient and asked her to call Estill Bamberg, CMA in Triage. (pt. May need to have labs with PCP?)

## 2020-06-15 NOTE — Telephone Encounter (Signed)
Patient is returning call. Can be reached at work number [phone].

## 2020-06-15 NOTE — Telephone Encounter (Signed)
Patient would like to have cholesterol labs done. Need order placed, patient scheduled for (06/16/20).

## 2020-06-16 ENCOUNTER — Other Ambulatory Visit: Payer: Self-pay

## 2020-06-16 ENCOUNTER — Other Ambulatory Visit (INDEPENDENT_AMBULATORY_CARE_PROVIDER_SITE_OTHER): Payer: 59

## 2020-06-16 DIAGNOSIS — E7800 Pure hypercholesterolemia, unspecified: Secondary | ICD-10-CM

## 2020-06-17 LAB — LIPID PANEL
Chol/HDL Ratio: 4.8 ratio — ABNORMAL HIGH (ref 0.0–4.4)
Cholesterol, Total: 259 mg/dL — ABNORMAL HIGH (ref 100–199)
HDL: 54 mg/dL (ref >39)
LDL Chol Calc (NIH): 188 mg/dL — ABNORMAL HIGH (ref 0–99)
Triglycerides: 100 mg/dL (ref 0–149)
VLDL Cholesterol Cal: 17 mg/dL (ref 5–40)

## 2020-07-21 ENCOUNTER — Ambulatory Visit (INDEPENDENT_AMBULATORY_CARE_PROVIDER_SITE_OTHER): Payer: 59 | Admitting: Family Medicine

## 2020-07-21 ENCOUNTER — Other Ambulatory Visit: Payer: Self-pay

## 2020-07-21 ENCOUNTER — Encounter: Payer: Self-pay | Admitting: Family Medicine

## 2020-07-21 VITALS — BP 110/78 | HR 70 | Temp 98.4°F | Wt 148.0 lb

## 2020-07-21 DIAGNOSIS — R5383 Other fatigue: Secondary | ICD-10-CM

## 2020-07-21 DIAGNOSIS — E782 Mixed hyperlipidemia: Secondary | ICD-10-CM | POA: Diagnosis not present

## 2020-07-21 DIAGNOSIS — R590 Localized enlarged lymph nodes: Secondary | ICD-10-CM | POA: Diagnosis not present

## 2020-07-21 DIAGNOSIS — M2550 Pain in unspecified joint: Secondary | ICD-10-CM

## 2020-07-21 LAB — CBC WITH DIFFERENTIAL/PLATELET
Basophils Absolute: 0.1 10*3/uL (ref 0.0–0.1)
Basophils Relative: 1 % (ref 0.0–3.0)
Eosinophils Absolute: 0.1 10*3/uL (ref 0.0–0.7)
Eosinophils Relative: 2.3 % (ref 0.0–5.0)
HCT: 40.4 % (ref 36.0–46.0)
Hemoglobin: 13.5 g/dL (ref 12.0–15.0)
Lymphocytes Relative: 32.9 % (ref 12.0–46.0)
Lymphs Abs: 1.9 10*3/uL (ref 0.7–4.0)
MCHC: 33.4 g/dL (ref 30.0–36.0)
MCV: 87.6 fl (ref 78.0–100.0)
Monocytes Absolute: 0.3 10*3/uL (ref 0.1–1.0)
Monocytes Relative: 6 % (ref 3.0–12.0)
Neutro Abs: 3.3 10*3/uL (ref 1.4–7.7)
Neutrophils Relative %: 57.8 % (ref 43.0–77.0)
Platelets: 227 10*3/uL (ref 150.0–400.0)
RBC: 4.61 Mil/uL (ref 3.87–5.11)
RDW: 14.2 % (ref 11.5–15.5)
WBC: 5.7 10*3/uL (ref 4.0–10.5)

## 2020-07-21 LAB — TSH: TSH: 1.79 u[IU]/mL (ref 0.35–4.50)

## 2020-07-21 LAB — T4, FREE: Free T4: 0.64 ng/dL (ref 0.60–1.60)

## 2020-07-21 LAB — VITAMIN B12: Vitamin B-12: 577 pg/mL (ref 211–911)

## 2020-07-21 LAB — VITAMIN D 25 HYDROXY (VIT D DEFICIENCY, FRACTURES): VITD: 35.8 ng/mL (ref 30.00–100.00)

## 2020-07-21 NOTE — Patient Instructions (Addendum)
We can recheck your cholesterol in 3-6 months. You can improve your cholesterol by: -limiting your intake of processed foods and foods high in saturated fats.  -increasing your physical activity -increasing your intake of vegetables, healthy fish (bluefin tuna, wild salmon, and sardines), whole grains and fiber rich foods .   -You can also use extra virgin olive oil instead of butter. -If you smoke, quitting can decrease LDL cholesterol and raise HDL cholesterol.     High Cholesterol  High cholesterol is a condition in which the blood has high levels of a white, waxy substance similar to fat (cholesterol). The liver makes all the cholesterol that the body needs. The human body needs small amounts of cholesterol to help build cells. A person gets extra or excess cholesterol from the food that he or she eats. The blood carries cholesterol from the liver to the rest of the body. If you have high cholesterol, deposits (plaques) may build up on the walls of your arteries. Arteries are the blood vessels that carry blood away from your heart. These plaques make the arteries narrow and stiff. Cholesterol plaques increase your risk for heart attack and stroke. Work with your health care provider to keep your cholesterol levels in a healthy range. What increases the risk? The following factors may make you more likely to develop this condition:  Eating foods that are high in animal fat (saturated fat) or cholesterol.  Being overweight.  Not getting enough exercise.  A family history of high cholesterol (familial hypercholesterolemia).  Use of tobacco products.  Having diabetes. What are the signs or symptoms? There are no symptoms of this condition. How is this diagnosed? This condition may be diagnosed based on the results of a blood test.  If you are older than 55 years of age, your health care provider may check your cholesterol levels every 4-6 years.  You may be checked more often if  you have high cholesterol or other risk factors for heart disease. The blood test for cholesterol measures:  "Bad" cholesterol, or LDL cholesterol. This is the main type of cholesterol that causes heart disease. The desired level is less than 100 mg/dL.  "Good" cholesterol, or HDL cholesterol. HDL helps protect against heart disease by cleaning the arteries and carrying the LDL to the liver for processing. The desired level for HDL is 60 mg/dL or higher.  Triglycerides. These are fats that your body can store or burn for energy. The desired level is less than 150 mg/dL.  Total cholesterol. This measures the total amount of cholesterol in your blood and includes LDL, HDL, and triglycerides. The desired level is less than 200 mg/dL. How is this treated? This condition may be treated with:  Diet changes. You may be asked to eat foods that have more fiber and less saturated fats or added sugar.  Lifestyle changes. These may include regular exercise, maintaining a healthy weight, and quitting use of tobacco products.  Medicines. These are given when diet and lifestyle changes have not worked. You may be prescribed a statin medicine to help lower your cholesterol levels. Follow these instructions at home: Eating and drinking  Eat a healthy, balanced diet. This diet includes: ? Daily servings of a variety of fresh, frozen, or canned fruits and vegetables. ? Daily servings of whole grain foods that are rich in fiber. ? Foods that are low in saturated fats and trans fats. These include poultry and fish without skin, lean cuts of meat, and low-fat dairy products. ?  A variety of fish, especially oily fish that contain omega-3 fatty acids. Aim to eat fish at least 2 times a week.  Avoid foods and drinks that have added sugar.  Use healthy cooking methods, such as roasting, grilling, broiling, baking, poaching, steaming, and stir-frying. Do not fry your food except for stir-frying.    Lifestyle  Get regular exercise. Aim to exercise for a total of 150 minutes a week. Increase your activity level by doing activities such as gardening, walking, and taking the stairs.  Do not use any products that contain nicotine or tobacco, such as cigarettes, e-cigarettes, and chewing tobacco. If you need help quitting, ask your health care provider.   General instructions  Take over-the-counter and prescription medicines only as told by your health care provider.  Keep all follow-up visits as told by your health care provider. This is important. Where to find more information  American Heart Association: www.heart.org  National Heart, Lung, and Blood Institute: https://wilson-eaton.com/ Contact a health care provider if:  You have trouble achieving or maintaining a healthy diet or weight.  You are starting an exercise program.  You are unable to stop smoking. Get help right away if:  You have chest pain.  You have trouble breathing.  You have any symptoms of a stroke. "BE FAST" is an easy way to remember the main warning signs of a stroke: ? B - Balance. Signs are dizziness, sudden trouble walking, or loss of balance. ? E - Eyes. Signs are trouble seeing or a sudden change in vision. ? F - Face. Signs are sudden weakness or numbness of the face, or the face or eyelid drooping on one side. ? A - Arms. Signs are weakness or numbness in an arm. This happens suddenly and usually on one side of the body. ? S - Speech. Signs are sudden trouble speaking, slurred speech, or trouble understanding what people say. ? T - Time. Time to call emergency services. Write down what time symptoms started.  You have other signs of a stroke, such as: ? A sudden, severe headache with no known cause. ? Nausea or vomiting. ? Seizure. These symptoms may represent a serious problem that is an emergency. Do not wait to see if the symptoms will go away. Get medical help right away. Call your local  emergency services (911 in the [place] Do not drive yourself to the hospital. Summary  Cholesterol plaques increase your risk for heart attack and stroke. Work with your health care provider to keep your cholesterol levels in a healthy range.  Eat a healthy, balanced diet, get regular exercise, and maintain a healthy weight.  Do not use any products that contain nicotine or tobacco, such as cigarettes, e-cigarettes, and chewing tobacco.  Get help right away if you have any symptoms of a stroke. This information is not intended to replace advice given to you by your health care provider. Make sure you discuss any questions you have with your health care provider. Document Revised: 05/26/2019 Document Reviewed: 05/26/2019 Elsevier Patient Education  2021 [place].  Lymphadenopathy  Lymphadenopathy means that your lymph glands are swollen or larger than normal. Lymph glands, also called lymph nodes, are collections of tissue that filter excess fluid, bacteria, viruses, and waste from your bloodstream. They are part of your body's disease-fighting system (immune system), which protects your body from germs. There may be different causes of lymphadenopathy, depending on where it is in your body. Some types go away on their own. Lymphadenopathy  can occur anywhere that you have lymph glands, including these areas:  Neck (cervical lymphadenopathy).  Chest (mediastinal lymphadenopathy).  Lungs (hilar lymphadenopathy).  Underarms (axillary lymphadenopathy).  Groin (inguinal lymphadenopathy). When your immune system responds to germs, infection-fighting cells and fluid build up in your lymph glands. This causes some swelling and enlargement. If the lymph nodes do not go back to normal size after you have an infection or disease, your health care provider may do tests. These tests help to monitor your condition and find the reason why the glands are still swollen and enlarged. Follow these  instructions at home:  Get plenty of rest.  Your health care provider may recommend over-the-counter medicines for pain. Take over-the-counter and prescription medicines only as told by your health care provider.  If directed, apply heat to swollen lymph glands as often as told by your health care provider. Use the heat source that your health care provider recommends, such as a moist heat pack or a heating pad. ? Place a towel between your skin and the heat source. ? Leave the heat on for 20-30 minutes. ? Remove the heat if your skin turns bright red. This is especially important if you are unable to feel pain, heat, or cold. You may have a greater risk of getting burned.  Check your affected lymph glands every day for changes. Check other lymph gland areas as told by your health care provider. Check for changes such as: ? More swelling. ? Sudden increase in size. ? Redness or pain. ? Hardness.  Keep all follow-up visits. This is important.   Contact a health care provider if you have:  Lymph glands that: ? Are still swollen after 2 weeks. ? Have suddenly gotten bigger or the swelling spreads. ? Are red, painful, or hard.  Fluid leaking from the skin near an enlarged lymph gland.  Problems with breathing.  A fever, chills, or night sweats.  Fatigue.  A sore throat.  Pain in your abdomen.  Weight loss. Get help right away if you have:  Severe pain.  Chest pain.  Shortness of breath. These symptoms may represent a serious problem that is an emergency. Do not wait to see if the symptoms will go away. Get medical help right away. Call your local emergency services (911 in the [place] Do not drive yourself to the hospital. Summary  Lymphadenopathy means that your lymph glands are swollen or larger than normal.  Lymph glands, also called lymph nodes, are collections of tissue that filter excess fluid, bacteria, viruses, and waste from the bloodstream. They are part of your  body's disease-fighting system (immune system).  Lymphadenopathy can occur anywhere that you have lymph glands.  If the lymph nodes do not go back to normal size after you have an infection or disease, your health care provider may do tests to monitor your condition and find the reason why the glands are still swollen and enlarged.  Check your affected lymph glands every day for changes. Check other lymph gland areas as told by your health care provider. This information is not intended to replace advice given to you by your health care provider. Make sure you discuss any questions you have with your health care provider. Document Revised: 04/21/2020 Document Reviewed: 04/21/2020 Elsevier Patient Education  2021 [place].  Fatigue If you have fatigue, you feel tired all the time and have a lack of energy or a lack of motivation. Fatigue may make it difficult to start or complete tasks  because of exhaustion. In general, occasional or mild fatigue is often a normal response to activity or life. However, long-lasting (chronic) or extreme fatigue may be a symptom of a medical condition. Follow these instructions at home: General instructions  Watch your fatigue for any changes.  Go to bed and get up at the same time every day.  Avoid fatigue by pacing yourself during the day and getting enough sleep at night.  Maintain a healthy weight. Medicines  Take over-the-counter and prescription medicines only as told by your health care provider.  Take a multivitamin, if told by your health care provider.  Do not use herbal or dietary supplements unless they are approved by your health care provider. Activity  Exercise regularly, as told by your health care provider.  Use or practice techniques to help you relax, such as yoga, tai chi, meditation, or massage therapy.   Eating and drinking  Avoid heavy meals in the evening.  Eat a well-balanced diet, which includes lean proteins, whole  grains, plenty of fruits and vegetables, and low-fat dairy products.  Avoid consuming too much caffeine.  Avoid the use of alcohol.  Drink enough fluid to keep your urine pale yellow.   Lifestyle  Change situations that cause you stress. Try to keep your work and personal schedule in balance.  Do not use any products that contain nicotine or tobacco, such as cigarettes and e-cigarettes. If you need help quitting, ask your health care provider.  Do not use drugs. Contact a health care provider if:  Your fatigue does not get better.  You have a fever.  You suddenly lose or gain weight.  You have headaches.  You have trouble falling asleep or sleeping through the night.  You feel angry, guilty, anxious, or sad.  You are unable to have a bowel movement (constipation).  Your skin is dry.  You have swelling in your legs or another part of your body. Get help right away if:  You feel confused.  Your vision is blurry.  You feel faint or you pass out.  You have a severe headache.  You have severe pain in your abdomen, your back, or the area between your waist and hips (pelvis).  You have chest pain, shortness of breath, or an irregular or fast heartbeat.  You are unable to urinate, or you urinate less than normal.  You have abnormal bleeding, such as bleeding from the rectum, vagina, nose, lungs, or nipples.  You vomit blood.  You have thoughts about hurting yourself or others. If you ever feel like you may hurt yourself or others, or have thoughts about taking your own life, get help right away. You can go to your nearest emergency department or call:  Your local emergency services (911 in the [place]  A suicide crisis helpline, such as the [facility] at 814-295-1030. This is open 24 hours a day. Summary  If you have fatigue, you feel tired all the time and have a lack of energy or a lack of motivation.  Fatigue may make it difficult  to start or complete tasks because of exhaustion.  Long-lasting (chronic) or extreme fatigue may be a symptom of a medical condition.  Exercise regularly, as told by your health care provider.  Change situations that cause you stress. Try to keep your work and personal schedule in balance. This information is not intended to replace advice given to you by your health care provider. Make sure you discuss any questions you  have with your health care provider. Document Revised: 01/15/2019 Document Reviewed: 03/21/2017 Elsevier Patient Education  2021 [place].  Preventing Osteoporosis, Adult Osteoporosis is a condition that causes the bones to lose density. This means that the bones become thinner, and the normal spaces in bone tissue become larger. Low bone density can make the bones weak and cause them to break more easily. Osteoporosis cannot always be prevented, but you can take steps to lower your risk of developing this condition. How can this condition affect me? If you develop osteoporosis, you will be more likely to break bones in your wrist, spine, or hip. Even a minor accident or injury can be enough to break weak bones. The bones will also be slower to heal. Osteoporosis can cause other problems as well, such as a stooped posture or trouble with movement. Osteoporosis can occur with aging. As you get older, you may lose bone tissue more quickly, or it may be replaced more slowly. Osteoporosis is more likely to develop if you have poor nutrition or do not get enough calcium or vitamin D. Other lifestyle factors can also play a role. By eating a well-balanced diet and making lifestyle changes, you can help keep your bones strong and healthy, lowering your chances of developing osteoporosis. What can increase my risk? The following factors may make you more likely to develop osteoporosis:  Having a family history of the condition.  Having poor nutrition or not getting enough calcium  or vitamin D.  Using certain medicines, such as steroid medicines or anti-seizure medicines.  Being any of the following: ? 79 years of age or older. ? Female. ? A woman who has gone through menopause (is postmenopausal). ? A person who is of European or Asian descent.  Using products that contain nicotine or tobacco, such as cigarettes, e-cigarettes, and chewing tobacco.  Not being physically active (being sedentary).  Having a small body frame. What actions can I take to prevent this? Get enough calcium  Make sure you get enough calcium every day. Calcium is the most important mineral for bone health. Most people can get enough calcium from their diet, but supplements may be recommended for people who are at risk for osteoporosis. Follow these guidelines: ? If you are age 8 or younger, aim to get 1,000 milligrams (mg) of calcium every day. ? If you are older than age 40, aim to get 1,200 mg of calcium every day.  Good sources of calcium include: ? Dairy products, such as low-fat or nonfat milk, cheese, and yogurt. ? Dark green leafy vegetables, such as bok choy and broccoli. ? Foods that have had calcium added to them (calcium-fortified foods), such as orange juice, cereal, bread, soy beverages, and tofu products. ? Nuts, such as almonds.  Check nutrition labels to see how much calcium is in a food or drink.   Get enough vitamin D  Try to get enough vitamin D every day. Vitamin D is the most essential vitamin for bone health. It helps the body absorb calcium. Follow these guidelines for how much vitamin D to get from food: ? If you are age 68 or younger, aim to get at least 600 international units (IU) every day. Your health care provider may suggest more. ? If you are older than age 92, aim to get at least 800 international units every day. Your health care provider may suggest more.  Good sources of vitamin D in your diet include: ? Egg yolks. ? Oily fish,  such as salmon,  sardines, and tuna. ? Milk and cereal fortified with vitamin D.  Your body also makes vitamin D when you are out in the sun. Exposing the bare skin on your face, arms, legs, or back to the sun for no more than 30 minutes a day, 2 times a week is more than enough. Beyond that, make sure you use sunblock to protect your skin from sunburn, which increases your risk for skin cancer. Exercise  Stay active and get exercise every day.  Ask your health care provider what types of exercise are best for you. Weight-bearing and strength-building activities are important for building and maintaining healthy bones. Some examples of these types of activities include: ? Walking and hiking. ? Jogging and running. ? Dancing. ? Gym exercises and lifting weights. ? Tennis and racquetball. ? Climbing stairs. ? Tai chi.   Make other lifestyle changes  Do not use any products that contain nicotine or tobacco, such as cigarettes, e-cigarettes, and chewing tobacco. If you need help quitting, ask your health care provider.  Lose weight if you are overweight.  If you drink alcohol: ? Limit how much you use to:  0-1 drink a day for women who are not pregnant.  0-2 drinks a day for men. ? Be aware of how much alcohol is in your drink. In the [place] one drink equals one 12 oz bottle of beer (355 mL), one 5 oz glass of wine (148 mL), or one 1 oz glass of hard liquor (44 mL). Where to find support If you need help making changes to prevent osteoporosis, talk with your health care provider. You can ask for a referral to a dietitian and a physical therapist. Where to find more information Learn more about osteoporosis from:  NIH Osteoporosis and Related [place]: www.bones.SouthExposed.es  [place] Office on [employer]: [place]s.tn  [place]: EquipmentWeekly.com.ee Summary  Osteoporosis is a condition that causes weak bones that are more likely to break.  Eat a  healthy diet, making sure you get enough calcium and vitamin D, and stay active by getting regular exercise to help prevent osteoporosis.  Other ways to reduce your risk of osteoporosis include maintaining a healthy weight and avoiding alcohol and products that contain nicotine or tobacco. This information is not intended to replace advice given to you by your health care provider. Make sure you discuss any questions you have with your health care provider. Document Revised: 12/11/2019 Document Reviewed: 12/11/2019 Elsevier Patient Education  [place].

## 2020-07-21 NOTE — Progress Notes (Signed)
Subjective:    Patient ID: Kristin Greene, female    DOB: 14-Sep-1965, 55 y.o.   MRN: [id number]  No chief complaint on file.   HPI Patient is a 55 year old female with past medical history significant for carpal tunnel bilaterally, IUD in place who was seen today for follow-up.  Patient notes cholesterol elevated at OB/GYN office on 06/16/2020.  Total cholesterol 259 and LDL 188.  Pt tired an OTC cholesterol-lowering supplement prior to having lab done.  Patient inquires about ways to prevent osteoporosis as her mother has it.  Patient also mentions enlarged, nontender lymph nodes in the neck a month or more.  Pt denies recent illness, sore throat, ear pain/pressure, seasonal allergies, dental pain.  Pt notes joint pain in elbows and left thumb.  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  Pain noted deep in pad of L thumb and medial R elbow.  Pt recently dx'd with b/l carpal tunnel syndrome, R>L.  Discomfort at times noted when driving and at night.  Will shake her hands for relief.  Had a steroid injection in L wrist.  Pt is R handed.  Pt mentions feeling tired.  Taking a MVI, exercising some, and eating healthy overall ([employer] meal kits) though loves sweets.  Past Medical History:  Diagnosis Date  . Anxiety    panic  . Cataracts, bilateral   . Elevated LDL cholesterol level   . Elevated serum creatinine 2020  . Endometriosis   . Microscopic hematuria    January 2017   . Retinal tear     Allergies  Allergen Reactions  . Sulfa Antibiotics   . Penicillins Rash    ROS General: Denies fever, chills, night sweats, changes in weight, changes in appetite +fatigue HEENT: Denies headaches, ear pain, changes in vision, rhinorrhea, sore throat  + cervical lymphadenopathy CV: Denies CP, palpitations, SOB, orthopnea Pulm: Denies SOB, cough, wheezing GI: Denies abdominal pain, nausea, vomiting, diarrhea, constipation GU: Denies dysuria, hematuria, frequency, vaginal  discharge Msk: Denies muscle cramps, +joint pains Neuro: Denies weakness, numbness, tingling Skin: Denies rashes, bruising Psych: Denies depression, anxiety, hallucinations     Objective:    Blood pressure 110/78, pulse 70, temperature 98.4 F (36.9 C), temperature source Oral, weight 148 lb (67.1 kg), SpO2 98 %.  Gen. Pleasant, well-nourished, in no distress, normal affect   HEENT: [place]/AT, face symmetric, conjunctiva clear, no scleral icterus, PERRLA, EOMI, nares patent without drainage, pharynx without erythema or exudate.  TMs full bilaterally. Neck: No JVD, no thyromegaly, cervical lymphadenopathy ~8 mm b/l.  No preauricular, occipital or supraclavicular lymph nodes palpated Lungs: no accessory muscle use Cardiovascular: RRR, no peripheral edema Musculoskeletal: No TTP of b/l hands, wrist, or elbows.  Mild edema of right thenar eminence.  No deformities, no cyanosis or clubbing, normal tone Neuro:  A&Ox3, CN II-XII intact, normal gait Skin:  Warm, no lesions/ rash   Wt Readings from Last 3 Encounters:  07/21/20 148 lb (67.1 kg)  12/03/19 147 lb (66.7 kg)  10/29/19 148 lb (67.1 kg)    Lab Results  Component Value Date   WBC 7.5 10/15/2019   HGB 13.1 10/15/2019   HCT 40.6 10/15/2019   PLT 232 10/15/2019   GLUCOSE 84 10/15/2019   CHOL 259 (H) 06/16/2020   TRIG 100 06/16/2020   HDL 54 06/16/2020   LDLCALC 188 (H) 06/16/2020   ALT 13 10/15/2019   AST 18 10/15/2019   NA 139 10/15/2019   K 4.7 10/15/2019  CL 102 10/15/2019   CREATININE 0.75 10/15/2019   BUN 14 10/15/2019   CO2 26 10/15/2019   TSH 1.770 10/15/2019    Assessment/Plan:  Mixed hyperlipidemia -Total cholesterol 259 and LDL 188 on 06/16/2020. -Discussed 10-year risk of CVD 1.83% and lifetime risk 39%.  Currently patient is not in a statin benefit group 2/2 10 years CVD risk less than 5%. -Optimize lifestyle modifications including diet, aerobic exercise, maintaining healthy weight, and avoiding tobacco  products. -Consider fish oil -Discussed rechecking lipid in 3-6 months.  For continued elevation or increase in cholesterol consider statin a few times per week. -Given handout  Fatigue, unspecified type -Discussed possible causes including vitamin deficiency -We will obtain labs -Given handout - Plan: Vitamin B12, Vitamin D, 25-hydroxy, TSH, T4, free  Lymphadenopathy, cervical -Possible causes including chronic allergies, recent infection -We will obtain CBC -Reevaluate in the next 2-3 months -For continued lymphadenopathy consider imaging to further evaluate such as [place] or CXR - Plan: CBC with Differential/Platelet  Multiple joint pain -Discussed possible causes including arthritis, overuse -Discussed supportive care including topical analgesics, NSAIDs, heat, massage modifications to workspace -Unable to view x-rays with hand surgeon as not visible in chart - Plan: CBC with Differential/Platelet  F/u in the next 2-3 months, sooner if needed.  Grier Mitts, MD

## 2020-08-06 ENCOUNTER — Encounter: Payer: Self-pay | Admitting: Family Medicine

## 2020-08-17 ENCOUNTER — Telehealth: Payer: Self-pay

## 2020-08-17 NOTE — Telephone Encounter (Signed)
Message  sent  to  Provider.

## 2020-08-18 NOTE — Telephone Encounter (Signed)
This encounter is complete

## 2020-09-01 ENCOUNTER — Ambulatory Visit (INDEPENDENT_AMBULATORY_CARE_PROVIDER_SITE_OTHER): Payer: 59

## 2020-09-01 ENCOUNTER — Other Ambulatory Visit: Payer: Self-pay

## 2020-09-01 DIAGNOSIS — E538 Deficiency of other specified B group vitamins: Secondary | ICD-10-CM | POA: Diagnosis not present

## 2020-09-01 MED ORDER — CYANOCOBALAMIN 1000 MCG/ML IJ SOLN
1000.0000 ug | Freq: Once | INTRAMUSCULAR | Status: AC
Start: 2020-09-01 — End: 2020-09-01
  Administered 2020-09-01: 1000 ug via INTRAMUSCULAR

## 2020-09-01 NOTE — Progress Notes (Signed)
Per orders of Dr. Banks, injection of Cyanocobalamin 1,000 mcg/mL given by Nancy N [place]. Patient tolerated injection well.  

## 2020-09-15 ENCOUNTER — Encounter: Payer: Self-pay | Admitting: Obstetrics and Gynecology

## 2020-09-15 ENCOUNTER — Other Ambulatory Visit: Payer: Self-pay | Admitting: Obstetrics and Gynecology

## 2020-09-15 MED ORDER — ALPRAZOLAM 0.25 MG PO TABS: 0.2500 mg | ORAL_TABLET | Freq: Three times a day (TID) | ORAL | 0 refills | Status: DC | PRN

## 2020-09-15 NOTE — Telephone Encounter (Signed)
Rx last filled in 10/2019

## 2020-09-20 ENCOUNTER — Other Ambulatory Visit: Payer: Self-pay | Admitting: Obstetrics and Gynecology

## 2020-09-20 DIAGNOSIS — Z1231 Encounter for screening mammogram for malignant neoplasm of breast: Secondary | ICD-10-CM

## 2020-10-05 ENCOUNTER — Other Ambulatory Visit: Payer: Self-pay

## 2020-10-06 ENCOUNTER — Ambulatory Visit (INDEPENDENT_AMBULATORY_CARE_PROVIDER_SITE_OTHER): Payer: 59

## 2020-10-06 DIAGNOSIS — E538 Deficiency of other specified B group vitamins: Secondary | ICD-10-CM | POA: Diagnosis not present

## 2020-10-06 MED ORDER — CYANOCOBALAMIN 1000 MCG/ML IJ SOLN
1000.0000 ug | Freq: Once | INTRAMUSCULAR | Status: AC
  Administered 2020-10-06: 1000 ug via INTRAMUSCULAR

## 2020-10-06 NOTE — Progress Notes (Signed)
Patient given vitamin B13 injection, tolerated well.

## 2020-11-03 ENCOUNTER — Other Ambulatory Visit: Payer: Self-pay

## 2020-11-03 ENCOUNTER — Ambulatory Visit
Admission: RE | Admit: 2020-11-03 | Discharge: 2020-11-03 | Disposition: A | Discharge status: Home / Self Care | Payer: 59 | Source: Ambulatory Visit

## 2020-11-03 DIAGNOSIS — Z1231 Encounter for screening mammogram for malignant neoplasm of breast: Secondary | ICD-10-CM

## 2020-11-05 ENCOUNTER — Other Ambulatory Visit: Payer: Self-pay | Admitting: Obstetrics and Gynecology

## 2020-11-05 DIAGNOSIS — R928 Other abnormal and inconclusive findings on diagnostic imaging of breast: Secondary | ICD-10-CM

## 2020-11-08 ENCOUNTER — Encounter: Payer: Self-pay | Admitting: Obstetrics and Gynecology

## 2020-11-15 ENCOUNTER — Other Ambulatory Visit: Payer: Self-pay | Admitting: *Deleted

## 2020-11-15 MED ORDER — SERTRALINE HCL 50 MG PO TABS: 50.0000 mg | ORAL_TABLET | Freq: Every day | ORAL | 0 refills | Status: DC

## 2020-11-15 NOTE — Progress Notes (Signed)
55 y.o. G35P2002 Married Caucasian female here for annual exam.    Saw her PCP for fatigue.  Received B12 injections, and she did not see a difference.  Sleeping well.   No spotting with Mirena.   Bladder controlled.  Not using protective pad.   Received her first Covid booster.   PCP:  Kristin Mitts, MD   No LMP recorded. (Menstrual status: IUD).     Period Cycle (Days):  (no cycle with Mirena IUD)     Sexually active: Yes.    The current method of family planning is Mirena IUD 10/29/19 Exercising: Yes.    mostly walking Smoker:  no  Health Maintenance: Pap: 09-11-18 Neg:Neg HR HPV, 07-28-15 Neg:Neg HR HPV; 05-01-12 Neg:Neg HR HPV History of abnormal Pap:  no MMG:  11-03-20 3D/poss.asymmetry Lt.Br.;Rt.Br.neg. Diag.Bil.w/Lt.Br.US scheduled 12-01-20 Colonoscopy:  2017 normal;next 10 years BMD:   n/a  Result  n/a TDaP:  04-06-10--wants today Gardasil:   no HIV:neg years ago Hep C: Unsure Screening Labs:  PCP   reports that she has never smoked. She has never used smokeless tobacco. She reports current alcohol use. She reports that she does not use drugs.  Past Medical History:  Diagnosis Date  . Anxiety    panic  . Cataracts, bilateral   . Elevated LDL cholesterol level   . Elevated serum creatinine 2020  . Endometriosis   . Microscopic hematuria    January 2017   . Retinal tear     Past Surgical History:  Procedure Laterality Date  . abdomnioplasty  06/2003  . LAPAROSCOPIC OOPHERECTOMY  2011  . PELVIC LAPAROSCOPY  03-01-10   LSO secondary torsion, endometriosis--Dr. Caren Griffins Romine  . RETINAL TEAR REPAIR CRYOTHERAPY Left 11/2014    Current Outpatient Medications  Medication Sig Dispense Refill  . ALPRAZolam (XANAX) 0.25 MG tablet Take 1 tablet (0.25 mg total) by mouth 3 (three) times daily as needed for anxiety. 30 tablet 0  . Multiple Vitamin (MULTIVITAMIN) capsule Take 1 capsule by mouth daily.    . sertraline (ZOLOFT) 50 MG tablet Take 1 tablet (50 mg total) by  mouth daily. 30 tablet 0   Current Facility-Administered Medications  Medication Dose Route Frequency Provider Last Rate Last Admin  . 0.9 %  sodium chloride infusion  500 mL Intravenous Continuous Pyrtle, Lajuan Lines, MD        Family History  Problem Relation Age of Onset  . Hypertension Father   . Hyperlipidemia Mother   . Osteoporosis Mother   . Breast cancer Mother 48       Only tx'd w/radiation  . Heart failure Maternal Grandmother        CHF  . Heart failure Paternal Grandmother        CHF  . Rheum arthritis Paternal Grandmother   . Colon cancer Neg Hx   . Esophageal cancer Neg Hx   . Stomach cancer Neg Hx   . Rectal cancer Neg Hx     Review of Systems  All other systems reviewed and are negative.   Exam:   BP 122/70   Pulse 81   Ht 5\' 6"  (1.676 m)   Wt 147 lb (66.7 kg)   SpO2 100%   BMI 23.73 kg/m     General appearance: alert, cooperative and appears stated age Head: normocephalic, without obvious abnormality, atraumatic Neck: no adenopathy, supple, symmetrical, trachea midline and thyroid normal to inspection and palpation Lungs: clear to auscultation bilaterally Breasts: normal appearance, no masses or tenderness, No nipple  retraction or dimpling, No nipple discharge or bleeding, No axillary adenopathy Heart: regular rate and rhythm Abdomen: soft, non-tender; no masses, no organomegaly Extremities: extremities normal, atraumatic, no cyanosis or edema Skin: skin color, texture, turgor normal. No rashes or lesions Lymph nodes: cervical, supraclavicular, and axillary nodes normal. Neurologic: grossly normal  Pelvic: External genitalia:  no lesions              No abnormal inguinal nodes palpated.              Urethra:  normal appearing urethra with no masses, tenderness or lesions              Bartholins and Skenes: normal                 Vagina: normal appearing vagina with normal color and discharge, no lesions              Cervix: no lesions.  IUD strings  noted.               Pap taken: No. Bimanual Exam:  Uterus:  normal size, contour, position, consistency, mobility, non-tender              Adnexa: no mass, fullness, tenderness              Rectal exam:  Declined.   Chaperone was present for exam:  Marisa Sprinkles.  Assessment:   Well woman visit with normal exam. Mirena IUD.  Fatigue.  Hx mild stress incontinence.Improved.  Anxiety.On Zoloft. Uses Xanax prn.  ElevatedLDLcholesterol. FH breast cancer:  Mother.  Plan: Mammogram screening discussed. Self breast awareness reviewed. Pap and HR HPV as above. Guidelines for Calcium, Vitamin D, regular exercise program including cardiovascular and weight bearing exercise. Mirena IUD effective for 7 years of use.  Refill Zoloft 50 mg daily.  We discussed Zoloft as potential medication causing fatigue.  Tdap.  Follow up annually and prn.

## 2020-11-15 NOTE — Telephone Encounter (Signed)
Medication refill request: Sertraline  Last AEX:  10-15-19 BS  Next AEX: 11-17-20  Last MMG (if hormonal medication request): n/a Refill authorized: Today, please advise.   Medication pended for #30, 0RF. Please refill if appropriate.

## 2020-11-17 ENCOUNTER — Other Ambulatory Visit: Payer: Self-pay

## 2020-11-17 ENCOUNTER — Encounter: Payer: Self-pay | Admitting: Obstetrics and Gynecology

## 2020-11-17 ENCOUNTER — Ambulatory Visit (INDEPENDENT_AMBULATORY_CARE_PROVIDER_SITE_OTHER): Payer: 59 | Admitting: Obstetrics and Gynecology

## 2020-11-17 VITALS — BP 122/70 | HR 81 | Ht 66.0 in | Wt 147.0 lb

## 2020-11-17 DIAGNOSIS — Z23 Encounter for immunization: Secondary | ICD-10-CM

## 2020-11-17 DIAGNOSIS — Z01419 Encounter for gynecological examination (general) (routine) without abnormal findings: Secondary | ICD-10-CM | POA: Diagnosis not present

## 2020-11-17 MED ORDER — SERTRALINE HCL 50 MG PO TABS: 50.0000 mg | ORAL_TABLET | Freq: Every day | ORAL | 3 refills | Status: DC

## 2020-11-17 NOTE — Patient Instructions (Signed)

## 2020-12-01 ENCOUNTER — Ambulatory Visit: Payer: 59

## 2020-12-01 ENCOUNTER — Other Ambulatory Visit: Payer: Self-pay

## 2020-12-01 ENCOUNTER — Ambulatory Visit
Admission: RE | Admit: 2020-12-01 | Discharge: 2020-12-01 | Disposition: A | Discharge status: Home / Self Care | Payer: 59 | Source: Ambulatory Visit | Attending: Obstetrics and Gynecology | Admitting: Obstetrics and Gynecology

## 2020-12-01 DIAGNOSIS — R928 Other abnormal and inconclusive findings on diagnostic imaging of breast: Secondary | ICD-10-CM

## 2021-05-17 ENCOUNTER — Encounter: Payer: Self-pay | Admitting: Obstetrics and Gynecology

## 2021-05-25 ENCOUNTER — Ambulatory Visit: Payer: 59 | Admitting: Family Medicine

## 2021-09-21 ENCOUNTER — Other Ambulatory Visit: Payer: Self-pay | Admitting: Obstetrics and Gynecology

## 2021-09-21 NOTE — Telephone Encounter (Signed)
Last AEX 11/17/20-scheduled for 12/07/21. ?Last mammo 11/03/20.  ?

## 2021-10-26 ENCOUNTER — Other Ambulatory Visit: Payer: Self-pay | Admitting: Obstetrics and Gynecology

## 2021-10-26 DIAGNOSIS — Z1231 Encounter for screening mammogram for malignant neoplasm of breast: Secondary | ICD-10-CM

## 2021-12-01 NOTE — Progress Notes (Signed)
56 y.o. G33P2002 Married Caucasian female here for annual exam.    Patient wants to discuss CAC score lab and if she should have it drawn.  Notes a right mandibular lymph node enlargement.  Not painful.   Doing well on Zoloft.  Needs refill and Rx for periodic Xanax.   No vaginal bleeding.  No hot flashes.  Does not want to remove her IUD.   PCP: Grier Mitts, MD  No LMP recorded. (Menstrual status: IUD).-Mirena 10/29/19.         Sexually active: Yes.    The current method of family planning is IUD.    Exercising: Yes.     Walking, cardio and yoga Smoker:  no  Health Maintenance: Pap:   09-11-18 Neg:Neg HR HPV, 07-28-15 Neg:Neg HR HPV; 05-01-12 Neg:Neg HR HPV History of abnormal Pap:  no MMG: 11-03-20 Lt.Br.asymmetry;Rt.Br.neg. Diag.Lt.Br.Neg/Birads1/screening 18yr--APPT changed to June, 2023.  Colonoscopy:  2017 normal;next 10 years BMD:   n/a  Result  n/a TDaP:  11-17-20 Gardasil:   no HIV:neg in the past Hep C: Unsure Screening Labs:  PCP   reports that she has never smoked. She has never used smokeless tobacco. She reports current alcohol use. She reports that she does not use drugs.  Past Medical History:  Diagnosis Date   Anxiety    panic   Cataracts, bilateral    Elevated LDL cholesterol level    Elevated serum creatinine 2020   Endometriosis    Microscopic hematuria    January 2017    Retinal tear     Past Surgical History:  Procedure Laterality Date   abdomnioplasty  06/2003   LAPAROSCOPIC OOPHERECTOMY  2011   PELVIC LAPAROSCOPY  03-01-10   LSO secondary torsion, endometriosis--Dr. CCaren GriffinsRomine   RETINAL TEAR REPAIR CRYOTHERAPY Left 11/2014    Current Outpatient Medications  Medication Sig Dispense Refill   ALPRAZolam (XANAX) 0.25 MG tablet TAKE 1 TABLET BY MOUTH 3 TIMES DAILY AS NEEDED FOR ANXIETY. 30 tablet 0   MAGNESIUM PO Take by mouth. Takes '100mg'$  daily     sertraline (ZOLOFT) 50 MG tablet Take 1 tablet (50 mg total) by mouth daily. 90 tablet 3    Current Facility-Administered Medications  Medication Dose Route Frequency Provider Last Rate Last Admin   0.9 %  sodium chloride infusion  500 mL Intravenous Continuous Pyrtle, JLajuan Lines MD        Family History  Problem Relation Age of Onset   Hypertension Father    Hyperlipidemia Mother    Osteoporosis Mother    Breast cancer Mother 753      Only tx'd w/radiation   Heart failure Maternal Grandmother        CHF   Heart failure Paternal Grandmother        CHF   Rheum arthritis Paternal Grandmother    Colon cancer Neg Hx    Esophageal cancer Neg Hx    Stomach cancer Neg Hx    Rectal cancer Neg Hx     Review of Systems  All other systems reviewed and are negative.  Exam:   BP 110/76   Pulse 86   Ht '5\' 7"'$  (1.702 m)   Wt 150 lb (68 kg)   SpO2 98%   BMI 23.49 kg/m     General appearance: alert, cooperative and appears stated age Head: normocephalic, without obvious abnormality, atraumatic Neck: no adenopathy, supple, symmetrical, trachea midline and thyroid normal to inspection and palpation Lungs: clear to auscultation bilaterally Breasts: normal appearance,  no masses or tenderness, No nipple retraction or dimpling, No nipple discharge or bleeding, No axillary adenopathy Heart: regular rate and rhythm Abdomen: soft, non-tender; no masses, no organomegaly Extremities: extremities normal, atraumatic, no cyanosis or edema Skin: skin color, texture, turgor normal. No rashes or lesions Lymph nodes: cervical, supraclavicular, and axillary nodes normal.  Right mandibular node around 7 - 8 mm, nontender.  Neurologic: grossly normal  Pelvic: External genitalia:  no lesions              No abnormal inguinal nodes palpated.              Urethra:  normal appearing urethra with no masses, tenderness or lesions              Bartholins and Skenes: normal                 Vagina: normal appearing vagina with normal color and discharge, no lesions              Cervix: no lesions.  IUD  strings noted.              Pap taken: no Bimanual Exam:  Uterus:  normal size, contour, position, consistency, mobility, non-tender              Adnexa: no mass, fullness, tenderness              Rectal exam: yes.  Confirms.              Anus:  normal sphincter tone, no lesions  Chaperone was present for exam:  Estill Bamberg, CMA  Assessment:   Well woman visit with gynecologic exam. Mirena IUD.  Anxiety.  On Zoloft. Uses Xanax prn.  Elevated LDL cholesterol. FH breast cancer:  Mother. Right mandibular lymphadenopathy.   Plan: Mammogram screening discussed. Self breast awareness reviewed. Pap and HR HPV as above. Guidelines for Calcium, Vitamin D, regular exercise program including cardiovascular and weight bearing exercise. Rx for Zoloft 50 mg daily and Xanax prn.  Patient will see her PCP for her labs and CAC testing.   Patient will see her oral surgeon for further evaluation of her isolated mandibular adenopathy.  Follow up annually and prn.   After visit summary provided.

## 2021-12-07 ENCOUNTER — Ambulatory Visit (INDEPENDENT_AMBULATORY_CARE_PROVIDER_SITE_OTHER): Payer: 59 | Admitting: Obstetrics and Gynecology

## 2021-12-07 ENCOUNTER — Encounter: Payer: Self-pay | Admitting: Obstetrics and Gynecology

## 2021-12-07 ENCOUNTER — Ambulatory Visit: Payer: 59

## 2021-12-07 VITALS — BP 110/76 | HR 86 | Ht 67.0 in | Wt 150.0 lb

## 2021-12-07 DIAGNOSIS — Z01419 Encounter for gynecological examination (general) (routine) without abnormal findings: Secondary | ICD-10-CM | POA: Diagnosis not present

## 2021-12-07 MED ORDER — ALPRAZOLAM 0.25 MG PO TABS
0.2500 mg | ORAL_TABLET | Freq: Three times a day (TID) | ORAL | 0 refills | Status: DC | PRN
Start: 2021-12-07 — End: 2022-08-15

## 2021-12-07 MED ORDER — SERTRALINE HCL 50 MG PO TABS: 50.0000 mg | ORAL_TABLET | Freq: Every day | ORAL | 3 refills | Status: DC

## 2021-12-07 NOTE — Patient Instructions (Signed)

## 2021-12-14 ENCOUNTER — Ambulatory Visit: Payer: 59

## 2021-12-28 ENCOUNTER — Ambulatory Visit
Admission: RE | Admit: 2021-12-28 | Discharge: 2021-12-28 | Disposition: A | Discharge status: Home / Self Care | Payer: 59 | Source: Ambulatory Visit | Attending: Obstetrics and Gynecology | Admitting: Obstetrics and Gynecology

## 2021-12-28 DIAGNOSIS — Z1231 Encounter for screening mammogram for malignant neoplasm of breast: Secondary | ICD-10-CM

## 2022-01-04 ENCOUNTER — Encounter: Payer: Self-pay | Admitting: Family Medicine

## 2022-01-04 ENCOUNTER — Ambulatory Visit (INDEPENDENT_AMBULATORY_CARE_PROVIDER_SITE_OTHER): Payer: 59 | Admitting: Family Medicine

## 2022-01-04 VITALS — BP 122/84 | HR 61 | Temp 98.5°F | Wt 149.2 lb

## 2022-01-04 DIAGNOSIS — E782 Mixed hyperlipidemia: Secondary | ICD-10-CM | POA: Diagnosis not present

## 2022-01-04 DIAGNOSIS — R0989 Other specified symptoms and signs involving the circulatory and respiratory systems: Secondary | ICD-10-CM | POA: Diagnosis not present

## 2022-01-04 LAB — COMPREHENSIVE METABOLIC PANEL
ALT: 17 U/L (ref 0–35)
AST: 20 U/L (ref 0–37)
Albumin: 4.3 g/dL (ref 3.5–5.2)
Alkaline Phosphatase: 57 U/L (ref 39–117)
BUN: 14 mg/dL (ref 6–23)
CO2: 29 mEq/L (ref 19–32)
Calcium: 9.4 mg/dL (ref 8.4–10.5)
Chloride: 104 mEq/L (ref 96–112)
Creatinine, Ser: 0.86 mg/dL (ref 0.40–1.20)
GFR: 75.96 mL/min (ref >60.00)
Glucose, Bld: 89 mg/dL (ref 70–99)
Potassium: 4.3 mEq/L (ref 3.5–5.1)
Sodium: 138 mEq/L (ref 135–145)
Total Bilirubin: 0.5 mg/dL (ref 0.2–1.2)
Total Protein: 7 g/dL (ref 6.0–8.3)

## 2022-01-04 LAB — LIPID PANEL
Cholesterol: 276 mg/dL — ABNORMAL HIGH (ref 0–200)
HDL: 54.1 mg/dL (ref >39.00)
LDL Cholesterol: 201 mg/dL — ABNORMAL HIGH (ref 0–99)
NonHDL: 221.87
Total CHOL/HDL Ratio: 5
Triglycerides: 105 mg/dL (ref 0.0–149.0)
VLDL: 21 mg/dL (ref 0.0–40.0)

## 2022-01-04 NOTE — Progress Notes (Signed)
Subjective:    Patient ID: Kristin Greene, female    DOB: [birth year], 56 y.o.   MRN: [id number]  Chief Complaint  Patient presents with   Hyperlipidemia    fasting    HPI Dr. Deanna Artis was seen today for f/u on HLD.  Pt states she has been doing well overall.  Endorses some stress, but is staying busy at work.  Pt's son just graduated from [employer] and will be attending [employer] for dental school in the Fall.   Pt exercising regularly, going to the gym, doing cycling classes, and yoga.  Patient eats healthy but loves sweets.  Has a family history of HLD, mom and brother.  Has concerns about statins.  Patient interested in coronary artery calcium scoring given history of HLD.   Pt had recent well woman exam with OB/Gyn Past Medical History:  Diagnosis Date   Anxiety    panic   Cataracts, bilateral    Elevated LDL cholesterol level    Elevated serum creatinine 2020   Endometriosis    Microscopic hematuria    January 2017    Retinal tear     Allergies  Allergen Reactions   Sulfa Antibiotics Other (See Comments)   Penicillins Rash    ROS General: Denies fever, chills, night sweats, changes in weight, changes in appetite HEENT: Denies headaches, ear pain, changes in vision, rhinorrhea, sore throat CV: Denies CP, palpitations, SOB, orthopnea Pulm: Denies SOB, cough, wheezing GI: Denies abdominal pain, nausea, vomiting, diarrhea, constipation GU: Denies dysuria, hematuria, frequency, vaginal discharge Msk: Denies muscle cramps, joint pains Neuro: Denies weakness, numbness, tingling Skin: Denies rashes, bruising Psych: Denies depression, anxiety, hallucinations  Objective:    Blood pressure 122/84, pulse 61, temperature 98.5 F (36.9 C), temperature source Oral, weight 149 lb 3.2 oz (67.7 kg), SpO2 98 %.  Gen. Pleasant, well-nourished, in no distress, normal affect   HEENT: [place]/AT, face symmetric, conjunctiva clear, no scleral icterus, PERRLA, EOMI, nares patent without  drainage. Neck: No JVD, no thyromegaly, R carotid bruits Lungs: no accessory muscle use, CTAB, no wheezes or rales Cardiovascular: RRR, no m/r/g, no peripheral edema Musculoskeletal: No deformities, no cyanosis or clubbing, normal tone Neuro:  A&Ox3, CN II-XII intact, normal gait Skin:  Warm, no lesions/ rash   Wt Readings from Last 3 Encounters:  01/04/22 149 lb 3.2 oz (67.7 kg)  12/07/21 150 lb (68 kg)  11/17/20 147 lb (66.7 kg)    Lab Results  Component Value Date   WBC 5.7 07/21/2020   HGB 13.5 07/21/2020   HCT 40.4 07/21/2020   PLT 227.0 07/21/2020   GLUCOSE 84 10/15/2019   CHOL 259 (H) 06/16/2020   TRIG 100 06/16/2020   HDL 54 06/16/2020   LDLCALC 188 (H) 06/16/2020   ALT 13 10/15/2019   AST 18 10/15/2019   NA 139 10/15/2019   K 4.7 10/15/2019   CL 102 10/15/2019   CREATININE 0.75 10/15/2019   BUN 14 10/15/2019   CO2 26 10/15/2019   TSH 1.79 07/21/2020    Assessment/Plan:  Mixed hyperlipidemia -Familial component noted as patient is overall healthy, exercising, and is mindful of cholesterol intake -Total cholesterol 259, LDL 188, HDL 54, and triglycerides 100 on 06/16/20 -In the past tried fish oil tablets with little to no improvement in cholesterol. -discussed r/b/a of starting a statin.  Also discussed fenofibrates and other cholesterol-lowering agents. -Continue lifestyle modifications -Patient interested in cardiac calcium scoring.  Referral to cardiology placed. - Plan: Lipid panel, CMP, US Carotid Duplex  Bilateral, Ambulatory referral to Cardiology  Bruit of right carotid artery  -R bruit noted on exam - Plan: US Carotid Duplex Bilateral, Ambulatory referral to Cardiology  F/u prn  Grier Mitts, MD

## 2022-01-06 ENCOUNTER — Encounter: Payer: Self-pay | Admitting: Family Medicine

## 2022-01-19 ENCOUNTER — Encounter: Payer: Self-pay | Admitting: Family Medicine

## 2022-01-20 ENCOUNTER — Ambulatory Visit
Admission: RE | Admit: 2022-01-20 | Discharge: 2022-01-20 | Disposition: A | Discharge status: Home / Self Care | Payer: 59 | Source: Ambulatory Visit | Attending: Family Medicine | Admitting: Family Medicine

## 2022-01-20 DIAGNOSIS — E782 Mixed hyperlipidemia: Secondary | ICD-10-CM

## 2022-01-20 DIAGNOSIS — R0989 Other specified symptoms and signs involving the circulatory and respiratory systems: Secondary | ICD-10-CM

## 2022-02-07 NOTE — Progress Notes (Unsigned)
No chief complaint on file.     History of Present Illness: 56 yo female with history of anxiety and hyperlipidemia who is here today as a new patient for the evaluation of ***  Primary Care Physician: Billie Ruddy, MD   Past Medical History:  Diagnosis Date   Anxiety    panic   Elevated LDL cholesterol level    Elevated serum creatinine 2020   Endometriosis    Microscopic hematuria    January 2017    Retinal tear     Past Surgical History:  Procedure Laterality Date   abdomnioplasty  06/2003   LAPAROSCOPIC OOPHERECTOMY  2011   PELVIC LAPAROSCOPY  03-01-10   LSO secondary torsion, endometriosis--Dr. Caren Griffins Romine   RETINAL TEAR REPAIR CRYOTHERAPY Left 11/2014    Current Outpatient Medications  Medication Sig Dispense Refill   ALPRAZolam (XANAX) 0.25 MG tablet Take 1 tablet (0.25 mg total) by mouth 3 (three) times daily as needed for anxiety. (Patient taking differently: Take 0.25 mg by mouth daily as needed for anxiety.) 30 tablet 0   MAGNESIUM PO Take by mouth. Takes '100mg'$  daily     sertraline (ZOLOFT) 50 MG tablet Take 1 tablet (50 mg total) by mouth daily. 90 tablet 3   Current Facility-Administered Medications  Medication Dose Route Frequency Provider Last Rate Last Admin   0.9 %  sodium chloride infusion  500 mL Intravenous Continuous Pyrtle, Lajuan Lines, MD        Allergies  Allergen Reactions   Sulfa Antibiotics Other (See Comments)   Penicillins Rash    Social History   Socioeconomic History   Marital status: Married    Spouse name: Not on file   Number of children: Not on file   Years of education: Not on file   Highest education level: Not on file  Occupational History   Occupation: Dentist   Tobacco Use   Smoking status: Never   Smokeless tobacco: Never  Vaping Use   Vaping Use: Never used  Substance and Sexual Activity   Alcohol use: Yes    Alcohol/week: 0.0 standard drinks of alcohol    Comment: 1-2 drinks per month   Drug use: No    Sexual activity: Yes    Partners: Male    Birth control/protection: I.U.D.    Comment: Mirena IUD 10-29-19  Other Topics Concern   Not on file  Social History Narrative   Not on file   Social Determinants of Health   Financial Resource Strain: Not on file  Food Insecurity: Not on file  Transportation Needs: Not on file  Physical Activity: Not on file  Stress: Not on file  Social Connections: Not on file  Intimate Partner Violence: Not on file    Family History  Problem Relation Age of Onset   Hypertension Father    Hyperlipidemia Mother    Osteoporosis Mother    Breast cancer Mother 54       Only tx'd w/radiation   Heart failure Maternal Grandmother        CHF   Heart failure Paternal Grandmother        CHF   Rheum arthritis Paternal Grandmother    Colon cancer Neg Hx    Esophageal cancer Neg Hx    Stomach cancer Neg Hx    Rectal cancer Neg Hx     Review of Systems:  As stated in the HPI and otherwise negative.   There were no vitals taken for this visit.  Physical  Examination: General: Well developed, well nourished, NAD  HEENT: OP clear, mucus membranes moist  SKIN: warm, dry. No rashes. Neuro: No focal deficits  Musculoskeletal: Muscle strength 5/5 all ext  Psychiatric: Mood and affect normal  Neck: No JVD, no carotid bruits, no thyromegaly, no lymphadenopathy.  Lungs:Clear bilaterally, no wheezes, rhonci, crackles Cardiovascular: Regular rate and rhythm. No murmurs, gallops or rubs. Abdomen:Soft. Bowel sounds present. Non-tender.  Extremities: No lower extremity edema. Pulses are 2 + in the bilateral DP/PT.  EKG:  EKG {ACTION; IS/IS [id number]} ordered today. The ekg ordered today demonstrates ***  Recent Labs: 01/04/2022: ALT 17; BUN 14; Creatinine, Ser 0.86; Potassium 4.3; Sodium 138   Lipid Panel    Component Value Date/Time   CHOL 276 (H) 01/04/2022 0857   CHOL 259 (H) 06/16/2020 0857   TRIG 105.0 01/04/2022 0857   HDL 54.10 01/04/2022 0857    HDL 54 06/16/2020 0857   CHOLHDL 5 01/04/2022 0857   VLDL 21.0 01/04/2022 0857   LDLCALC 201 (H) 01/04/2022 0857   LDLCALC 188 (H) 06/16/2020 0857     Wt Readings from Last 3 Encounters:  01/04/22 149 lb 3.2 oz (67.7 kg)  12/07/21 150 lb (68 kg)  11/17/20 147 lb (66.7 kg)     Assessment and Plan:   1. ***  Labs/ tests ordered today include:  No orders of the defined types were placed in this encounter.    Disposition:   F/U with me ***    Signed, Lauree Chandler, MD 02/07/2022 11:03 AM    [facility] [place], [place], [place]  [address] Phone: [phone]; Fax: [phone]

## 2022-02-08 ENCOUNTER — Ambulatory Visit (INDEPENDENT_AMBULATORY_CARE_PROVIDER_SITE_OTHER): Payer: 59 | Admitting: Cardiovascular Disease

## 2022-02-08 ENCOUNTER — Encounter: Payer: Self-pay | Admitting: Cardiovascular Disease

## 2022-02-08 VITALS — BP 124/78 | HR 70 | Ht 67.0 in | Wt 148.8 lb

## 2022-02-08 DIAGNOSIS — Z7189 Other specified counseling: Secondary | ICD-10-CM

## 2022-02-08 DIAGNOSIS — E7800 Pure hypercholesterolemia, unspecified: Secondary | ICD-10-CM

## 2022-02-08 NOTE — Patient Instructions (Signed)
Medication Instructions:  No changes  *If you need a refill on your cardiac medications before your next appointment, please call your pharmacy*   Lab Work: None ordered   Testing/Procedures: Calcium Score CT - $95 out of pocket charge for this test.   Follow-Up: As needed   Important Information About Sugar

## 2022-05-10 ENCOUNTER — Other Ambulatory Visit: Payer: 59

## 2022-05-17 ENCOUNTER — Ambulatory Visit
Admission: RE | Admit: 2022-05-17 | Discharge: 2022-05-17 | Disposition: A | Discharge status: Home / Self Care | Payer: No Typology Code available for payment source | Source: Ambulatory Visit | Attending: Cardiovascular Disease | Admitting: Cardiovascular Disease

## 2022-05-17 DIAGNOSIS — Z7189 Other specified counseling: Secondary | ICD-10-CM

## 2022-06-15 ENCOUNTER — Encounter: Payer: Self-pay | Admitting: Cardiovascular Disease

## 2022-08-15 ENCOUNTER — Other Ambulatory Visit: Payer: Self-pay | Admitting: Obstetrics and Gynecology

## 2022-08-16 MED ORDER — ALPRAZOLAM 0.25 MG PO TABS
0.2500 mg | ORAL_TABLET | Freq: Three times a day (TID) | ORAL | 0 refills | Status: DC | PRN
Start: 2022-08-16 — End: 2023-08-31

## 2022-08-16 NOTE — Telephone Encounter (Signed)
AEX 12/07/2021

## 2022-11-21 ENCOUNTER — Other Ambulatory Visit: Payer: Self-pay | Admitting: Obstetrics and Gynecology

## 2022-11-21 DIAGNOSIS — Z1231 Encounter for screening mammogram for malignant neoplasm of breast: Secondary | ICD-10-CM

## 2022-12-06 NOTE — Progress Notes (Signed)
57 y.o. G31P2002 Married Caucasian female here for annual exam.    Difficulty losing weight.   One son is going to dental school.  Other son will graduate from [employer] next year.   FSH 16.3 on 10/22/19. No hot flashes.  No menses.   Wants to take Zoloft to control anxiety.  Uses Xanax rarely, and she does not need a refill.  Elevated cholesterol. Saw cardiology. Calcium score of 1.3. She declined a statin.  PCP:   Abbe Amsterdam, MD  No LMP recorded. (Menstrual status: IUD).           Sexually active: Yes.    The current method of family planning is IUD.   Mirena placed 10/29/19. Exercising: Yes.     Spin class, weight lifting, walking Smoker:  no  Health Maintenance: Pap:   09-11-18 Neg:Neg HR HPV, 07-28-15 Neg:Neg HR HPV; 05-01-12 Neg:Neg HR HPV  History of abnormal Pap:  no MMG:  12/28/21 Breast Density Cat C, BI-RADS CAT 1 neg.  Has appointment.  Colonoscopy:  2018 normal - due in 10 years. BMD:   n/a  Result  n/a TDaP:  11/17/20 Gardasil:   no HIV: neg in the past Hep C: unsure Screening Labs:  PCP   reports that she has never smoked. She has never used smokeless tobacco. She reports current alcohol use. She reports that she does not use drugs.  Past Medical History:  Diagnosis Date   Anxiety    panic   Elevated LDL cholesterol level    Elevated serum creatinine 2020   Endometriosis    Microscopic hematuria    January 2017    Retinal tear     Past Surgical History:  Procedure Laterality Date   abdomnioplasty  06/2003   LAPAROSCOPIC OOPHERECTOMY  2011   PELVIC LAPAROSCOPY  03-01-10   LSO secondary torsion, endometriosis--Dr. Aram Beecham Romine   RETINAL TEAR REPAIR CRYOTHERAPY Left 11/2014    Current Outpatient Medications  Medication Sig Dispense Refill   ALPRAZolam (XANAX) 0.25 MG tablet Take 1 tablet (0.25 mg total) by mouth 3 (three) times daily as needed for anxiety. 30 tablet 0   levonorgestrel (MIRENA, 52 MG,) 20 MCG/DAY IUD by Intrauterine route.      sertraline (ZOLOFT) 50 MG tablet Take 1 tablet (50 mg total) by mouth daily. 90 tablet 3   Current Facility-Administered Medications  Medication Dose Route Frequency Provider Last Rate Last Admin   0.9 %  sodium chloride infusion  500 mL Intravenous Continuous Pyrtle, Carie Caddy, MD        Family History  Problem Relation Age of Onset   Hypertension Father    Hyperlipidemia Mother    Osteoporosis Mother    Breast cancer Mother 43       Only tx'd w/radiation   Heart failure Maternal Grandmother        CHF   Heart failure Paternal Grandmother        CHF   Rheum arthritis Paternal Grandmother    Colon cancer Neg Hx    Esophageal cancer Neg Hx    Stomach cancer Neg Hx    Rectal cancer Neg Hx     Review of Systems  All other systems reviewed and are negative.   Exam:   BP 124/76 (BP Location: Left Arm, Patient Position: Sitting, Cuff Size: Normal)   Pulse 72   Ht 5\' 7"  (1.702 m)   Wt 144 lb (65.3 kg)   SpO2 99%   BMI 22.55 kg/m  General appearance: alert, cooperative and appears stated age Head: normocephalic, without obvious abnormality, atraumatic Neck: 1 cm submandibular lymph node palpable (no change), supple, symmetrical, trachea midline and thyroid normal to inspection and palpation Lungs: clear to auscultation bilaterally Breasts: normal appearance, no masses or tenderness, No nipple retraction or dimpling, No nipple discharge or bleeding, No axillary adenopathy Heart: regular rate and rhythm Abdomen: soft, non-tender; no masses, no organomegaly Extremities: extremities normal, atraumatic, no cyanosis or edema Skin: skin color, texture, turgor normal. No rashes or lesions Lymph nodes: cervical, supraclavicular, and axillary nodes normal. Neurologic: grossly normal  Pelvic: External genitalia:  no lesions              No abnormal inguinal nodes palpated.              Urethra:  normal appearing urethra with no masses, tenderness or lesions              Bartholins and  Skenes: normal                 Vagina: normal appearing vagina with normal color and discharge, no lesions              Cervix: no lesions.  IUD strings noted.               Pap taken: no Bimanual Exam:  Uterus:  normal size, contour, position, consistency, mobility, non-tender              Adnexa: no mass, fullness, tenderness              Rectal exam: yes.  Confirms.              Anus:  normal sphincter tone, no lesions  Chaperone was present for exam:  Warren Lacy, CMA  Assessment:   Well woman visit with gynecologic exam. Mirena IUD.  Amenorrhea.  Anxiety.  On Zoloft.  Uses Xanax rarely prn.  Elevated LDL cholesterol. FH breast cancer:  Mother. Right mandibular lymphadenopathy.  Stable.   Plan: Mammogram screening discussed.   Self breast awareness reviewed. Pap and HR HPV 2025. Guidelines for Calcium, Vitamin D, regular exercise program including cardiovascular and weight bearing exercise. Refill Zoloft 50 mg daily for one year.  She will let me know if she needs a refill of Xanax. She will do a cholesterol recheck with her PCP.  Follow up annually and prn.   After visit summary provided.

## 2022-12-20 ENCOUNTER — Ambulatory Visit (INDEPENDENT_AMBULATORY_CARE_PROVIDER_SITE_OTHER): Payer: 59 | Admitting: Obstetrics and Gynecology

## 2022-12-20 ENCOUNTER — Encounter: Payer: Self-pay | Admitting: Obstetrics and Gynecology

## 2022-12-20 VITALS — BP 124/76 | HR 72 | Ht 67.0 in | Wt 144.0 lb

## 2022-12-20 DIAGNOSIS — N912 Amenorrhea, unspecified: Secondary | ICD-10-CM | POA: Diagnosis not present

## 2022-12-20 DIAGNOSIS — Z01419 Encounter for gynecological examination (general) (routine) without abnormal findings: Secondary | ICD-10-CM | POA: Diagnosis not present

## 2022-12-20 MED ORDER — SERTRALINE HCL 50 MG PO TABS: 50.0000 mg | ORAL_TABLET | Freq: Every day | ORAL | 3 refills | Status: DC

## 2022-12-20 NOTE — Patient Instructions (Signed)

## 2022-12-21 LAB — FOLLICLE STIMULATING HORMONE: FSH: 95.4 m[IU]/mL

## 2022-12-21 LAB — ESTRADIOL: Estradiol: <15 pg/mL

## 2023-01-17 ENCOUNTER — Ambulatory Visit
Admission: RE | Admit: 2023-01-17 | Discharge: 2023-01-17 | Disposition: A | Discharge status: Home / Self Care | Payer: 59 | Source: Ambulatory Visit

## 2023-01-17 DIAGNOSIS — Z1231 Encounter for screening mammogram for malignant neoplasm of breast: Secondary | ICD-10-CM

## 2023-08-31 ENCOUNTER — Other Ambulatory Visit: Payer: Self-pay | Admitting: Obstetrics and Gynecology

## 2023-09-03 MED ORDER — ALPRAZOLAM 0.25 MG PO TABS: 0.2500 mg | ORAL_TABLET | Freq: Three times a day (TID) | ORAL | 0 refills | Status: AC | PRN

## 2023-09-03 NOTE — Telephone Encounter (Signed)
 Med refill request: Xanax 0.25mg   Last AEX: 12/20/22  Next AEX: not scheduled, message sent to scheduling department about patient scheduling next aex.  Last MMG (if hormonal med) 01/17/23 birads cat 1 neg  Refill authorized: Last rx 08/16/22 #30 with 0 refills. Please approve or deny as appropriate.

## 2023-12-05 ENCOUNTER — Encounter: Payer: Self-pay | Admitting: Family Medicine

## 2023-12-05 ENCOUNTER — Ambulatory Visit (INDEPENDENT_AMBULATORY_CARE_PROVIDER_SITE_OTHER): Admitting: Family Medicine

## 2023-12-05 VITALS — BP 118/72 | HR 70 | Temp 99.0°F | Ht 67.0 in | Wt 151.0 lb

## 2023-12-05 DIAGNOSIS — M25522 Pain in left elbow: Secondary | ICD-10-CM | POA: Diagnosis not present

## 2023-12-05 DIAGNOSIS — L659 Nonscarring hair loss, unspecified: Secondary | ICD-10-CM | POA: Diagnosis not present

## 2023-12-05 DIAGNOSIS — M2550 Pain in unspecified joint: Secondary | ICD-10-CM | POA: Diagnosis not present

## 2023-12-05 DIAGNOSIS — M25521 Pain in right elbow: Secondary | ICD-10-CM | POA: Diagnosis not present

## 2023-12-05 LAB — CBC WITH DIFFERENTIAL/PLATELET
Basophils Absolute: 0.1 10*3/uL (ref 0.0–0.1)
Basophils Relative: 0.9 % (ref 0.0–3.0)
Eosinophils Absolute: 0.1 10*3/uL (ref 0.0–0.7)
Eosinophils Relative: 1.2 % (ref 0.0–5.0)
HCT: 38.8 % (ref 36.0–46.0)
Hemoglobin: 12.8 g/dL (ref 12.0–15.0)
Lymphocytes Relative: 28.7 % (ref 12.0–46.0)
Lymphs Abs: 2.2 10*3/uL (ref 0.7–4.0)
MCHC: 33 g/dL (ref 30.0–36.0)
MCV: 86 fl (ref 78.0–100.0)
Monocytes Absolute: 0.4 10*3/uL (ref 0.1–1.0)
Monocytes Relative: 5.7 % (ref 3.0–12.0)
Neutro Abs: 4.9 10*3/uL (ref 1.4–7.7)
Neutrophils Relative %: 63.5 % (ref 43.0–77.0)
Platelets: 240 10*3/uL (ref 150.0–400.0)
RBC: 4.52 Mil/uL (ref 3.87–5.11)
RDW: 14.4 % (ref 11.5–15.5)
WBC: 7.7 10*3/uL (ref 4.0–10.5)

## 2023-12-05 LAB — COMPREHENSIVE METABOLIC PANEL WITH GFR
ALT: 14 U/L (ref 0–35)
AST: 17 U/L (ref 0–37)
Albumin: 4.4 g/dL (ref 3.5–5.2)
Alkaline Phosphatase: 61 U/L (ref 39–117)
BUN: 18 mg/dL (ref 6–23)
CO2: 30 meq/L (ref 19–32)
Calcium: 9.1 mg/dL (ref 8.4–10.5)
Chloride: 103 meq/L (ref 96–112)
Creatinine, Ser: 0.94 mg/dL (ref 0.40–1.20)
GFR: 67.36 mL/min (ref >60.00)
Glucose, Bld: 85 mg/dL (ref 70–99)
Potassium: 3.9 meq/L (ref 3.5–5.1)
Sodium: 139 meq/L (ref 135–145)
Total Bilirubin: 0.3 mg/dL (ref 0.2–1.2)
Total Protein: 7.1 g/dL (ref 6.0–8.3)

## 2023-12-05 LAB — TSH: TSH: 1.56 u[IU]/mL (ref 0.35–5.50)

## 2023-12-05 LAB — T4, FREE: Free T4: 0.76 ng/dL (ref 0.60–1.60)

## 2023-12-05 NOTE — Progress Notes (Signed)
 Established Patient Office Visit   Subjective  Patient ID: Kristin Greene, female    DOB: [birth year]  Age: 58 y.o. MRN: [id number]  Chief Complaint  Patient presents with   Medical Management of Chronic Issues    Joint pain, started 3 months ago, elbows and right big toe is the worst, the pain wakes the patient at night, rate of ain 1 out of 10     Patient is a 58 year old female seen for ongoing concerns.  Patient endorses achy joints x 3 months or so.  Patient notes pain/discomfort in bilateral elbows and right great toe at times wakes her up at night.  Patient denies edema, erythema, injury.  Typically pushes through and does not take anything during the day.  Endorses a history of rheumatoid arthritis in paternal grandmother.  Patient has history of bilateral carpal tunnel syndrome.  Wearing braces at night.  Has received an injection in left wrist and discussed surgery with hand surgeon.  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  Patient also notes hair and eyebrows shedding more than normal.  Thinks amount of hair shedding from head is about normal.    Patient Active Problem List   Diagnosis Date Noted   Bilateral carpal tunnel syndrome 09/05/2017   Allergic reaction 04/07/2017   Past Medical History:  Diagnosis Date   Anxiety    panic   Elevated LDL cholesterol level    Elevated serum creatinine 2020   Endometriosis    Microscopic hematuria    January 2017    Retinal tear    Past Surgical History:  Procedure Laterality Date   abdomnioplasty  06/2003   LAPAROSCOPIC OOPHERECTOMY  2011   PELVIC LAPAROSCOPY  03-01-10   LSO secondary torsion, endometriosis--Dr. Adah Acron Romine   RETINAL TEAR REPAIR CRYOTHERAPY Left 11/2014   Social History   Tobacco Use   Smoking status: Never   Smokeless tobacco: Never  Vaping Use   Vaping status: Never Used  Substance Use Topics   Alcohol use: Yes    Alcohol/week: 0.0  standard drinks of alcohol    Comment: 1-2 drinks per month   Drug use: No   Family History  Problem Relation Age of Onset   Hypertension Father    Hyperlipidemia Mother    Osteoporosis Mother    Breast cancer Mother 64       Only tx'd w/radiation   Heart failure Maternal Grandmother        CHF   Heart failure Paternal Grandmother        CHF   Rheum arthritis Paternal Grandmother    Colon cancer Neg Hx    Esophageal cancer Neg Hx    Stomach cancer Neg Hx    Rectal cancer Neg Hx    Allergies  Allergen Reactions   Sulfa Antibiotics Other (See Comments)   Penicillins Rash    ROS Negative unless stated above    Objective:      BP 118/72 (BP Location: Left Arm, Patient Position: Sitting, Cuff Size: Normal)   Pulse 70   Temp 99 F (37.2 C) (Oral)   Ht 5\' 7"  (1.702 m)   Wt 151 lb (68.5 kg)   LMP  (LMP Unknown)   SpO2 99%   BMI 23.65 kg/m  BP Readings from Last 3 Encounters:  12/05/23 118/72  12/20/22 124/76  02/08/22 124/78   Wt Readings from Last 3 Encounters:  12/05/23 151 lb (68.5  kg)  12/20/22 144 lb (65.3 kg)  02/08/22 148 lb 12.8 oz (67.5 kg)      Physical Exam Constitutional:      General: She is not in acute distress.    Appearance: Normal appearance.  HENT:     Head: Normocephalic and atraumatic.     Nose: Nose normal.     Mouth/Throat:     Mouth: Mucous membranes are moist.  Cardiovascular:     Rate and Rhythm: Normal rate and regular rhythm.     Heart sounds: Normal heart sounds. No murmur heard.    No gallop.  Pulmonary:     Effort: Pulmonary effort is normal. No respiratory distress.     Breath sounds: Normal breath sounds. No wheezing, rhonchi or rales.  Musculoskeletal:       Arms:     Comments: No deformity, erythema, edema bilateral elbows anteriorly or posteriorly.  No numbness or tingling with tapping on bilateral lateral and medial epicondyles.  TTP of posterior L elbow medially, deep and of posterior right elbow laterally/deep.   No crepitus or decreased ROM.  Skin:    General: Skin is warm and dry.  Neurological:     Mental Status: She is alert and oriented to person, place, and time.        12/05/2023    2:53 PM 01/04/2022    8:30 AM  Depression screen PHQ 2/9  Decreased Interest 0 0  Down, Depressed, Hopeless 1 0  PHQ - 2 Score 1 0  Altered sleeping 1 0  Tired, decreased energy 1 0  Change in appetite 0 0  Feeling bad or failure about yourself  1 0  Trouble concentrating 0 0  Moving slowly or fidgety/restless 0 0  Suicidal thoughts 0 0  PHQ-9 Score 4 0  Difficult doing work/chores Not difficult at all       12/05/2023    2:53 PM  GAD 7 : Generalized Anxiety Score  Nervous, Anxious, on Edge 3  Control/stop worrying 1  Worry too much - different things 2  Trouble relaxing 1  Restless 0  Easily annoyed or irritable 1  Afraid - awful might happen 0  Total GAD 7 Score 8  Anxiety Difficulty Not difficult at all   Assessment & Plan:   Multiple joint pain -     Rheumatoid factor -     CBC with Differential/Platelet -     Lupus (SLE) Analysis -     Comprehensive metabolic panel with GFR  Bilateral elbow joint pain -     CBC with Differential/Platelet  Hair loss -     CBC with Differential/Platelet -     TSH -     T4, free -     Comprehensive metabolic panel with GFR  Pt with pain in multiple joints.  Discussed possible causes of symptoms including arthritis (OA versus RA).  Also consider tendinitis due to overuse.  Symptoms less likely from neuropathy as no numbness or tingling noted.  Supportive care including Tylenol, NSAIDs, topical analgesics, stretching, massage, ice, heat, compression band or sleeve.  Offered steroids/joint injection.  Patient wishes to wait at this time.  Will obtain labs including RF and SLE given family history.  Increase hair shedding.  Discussed possible causes.  Obtain labs to evaluate thyroid .  Return if symptoms worsen or fail to improve.   Viola Greulich,  MD

## 2023-12-06 LAB — RHEUMATOID FACTOR: Rheumatoid fact SerPl-aCnc: <10 [IU]/mL (ref <14)

## 2023-12-07 ENCOUNTER — Ambulatory Visit: Payer: Self-pay | Admitting: Family Medicine

## 2023-12-07 LAB — LUPUS (SLE) ANALYSIS
Anti Nuclear Antibody (ANA): NEGATIVE
Complement C4, Serum: 17 mg/dL (ref 12–38)
ENA RNP Ab: <0.2 AI (ref 0.0–0.9)
ENA SM Ab Ser-aCnc: <0.2 AI (ref 0.0–0.9)
ENA SSA (RO) Ab: <0.2 AI (ref 0.0–0.9)
ENA SSB (LA) Ab: <0.2 AI (ref 0.0–0.9)
Mitochondrial Ab: <20 U (ref 0.0–20.0)
Parietal Cell Ab: 8.7 U (ref 0.0–20.0)
Scleroderma (Scl-70) (ENA) Antibody, IgG: <0.2 AI (ref 0.0–0.9)
Smooth Muscle Ab: 9 U (ref 0–19)
Thyroperoxidase Ab SerPl-aCnc: 14 [IU]/mL (ref 0–34)
dsDNA Ab: <1 [IU]/mL (ref 0–9)

## 2023-12-24 ENCOUNTER — Other Ambulatory Visit: Payer: Self-pay | Admitting: Obstetrics and Gynecology

## 2023-12-24 DIAGNOSIS — Z1231 Encounter for screening mammogram for malignant neoplasm of breast: Secondary | ICD-10-CM

## 2024-01-02 ENCOUNTER — Ambulatory Visit: Payer: 59 | Admitting: Obstetrics and Gynecology

## 2024-01-03 ENCOUNTER — Other Ambulatory Visit: Payer: Self-pay | Admitting: Obstetrics and Gynecology

## 2024-01-04 NOTE — Telephone Encounter (Signed)
 Med refill request: sertraline  50 mg Last AEX: 12/09/22 Next AEX: 04/16/24  Last MMG (if hormonal med) n/a Refill authorized: Please approve or deny as appropriate.

## 2024-01-30 ENCOUNTER — Ambulatory Visit
Admission: RE | Admit: 2024-01-30 | Discharge: 2024-01-30 | Disposition: A | Discharge status: Home / Self Care | Source: Ambulatory Visit

## 2024-01-30 DIAGNOSIS — Z1231 Encounter for screening mammogram for malignant neoplasm of breast: Secondary | ICD-10-CM

## 2024-02-05 ENCOUNTER — Ambulatory Visit: Payer: Self-pay | Admitting: Obstetrics and Gynecology

## 2024-04-16 ENCOUNTER — Ambulatory Visit: Admitting: Obstetrics and Gynecology

## 2024-04-16 ENCOUNTER — Other Ambulatory Visit (HOSPITAL_COMMUNITY)
Admission: RE | Admit: 2024-04-16 | Discharge: 2024-04-16 | Disposition: A | Discharge status: Home / Self Care | Source: Ambulatory Visit | Attending: Obstetrics and Gynecology | Admitting: Obstetrics and Gynecology

## 2024-04-16 ENCOUNTER — Encounter: Payer: Self-pay | Admitting: Obstetrics and Gynecology

## 2024-04-16 VITALS — BP 124/72 | HR 74 | Ht 67.5 in | Wt 150.0 lb

## 2024-04-16 DIAGNOSIS — Z1331 Encounter for screening for depression: Secondary | ICD-10-CM | POA: Diagnosis not present

## 2024-04-16 DIAGNOSIS — Z124 Encounter for screening for malignant neoplasm of cervix: Secondary | ICD-10-CM

## 2024-04-16 DIAGNOSIS — Z01419 Encounter for gynecological examination (general) (routine) without abnormal findings: Secondary | ICD-10-CM

## 2024-04-16 DIAGNOSIS — N912 Amenorrhea, unspecified: Secondary | ICD-10-CM | POA: Diagnosis not present

## 2024-04-16 DIAGNOSIS — E7800 Pure hypercholesterolemia, unspecified: Secondary | ICD-10-CM

## 2024-04-16 LAB — ESTRADIOL: Estradiol: <15 pg/mL

## 2024-04-16 LAB — FOLLICLE STIMULATING HORMONE: FSH: 52.6 m[IU]/mL

## 2024-04-16 MED ORDER — SERTRALINE HCL 50 MG PO TABS: 50.0000 mg | ORAL_TABLET | Freq: Every day | ORAL | 3 refills | Status: AC

## 2024-04-16 MED ORDER — ESTRADIOL 0.05 MG/24HR TD PTTW: 1.0000 | MEDICATED_PATCH | TRANSDERMAL | 1 refills | Status: DC

## 2024-04-16 NOTE — Patient Instructions (Signed)

## 2024-04-16 NOTE — Progress Notes (Signed)
 58 y.o. G23P2002 Married Caucasian female here for annual exam. Having trouble losing weight and having joint pain. Does not know where she is at in menopause. Still getting some cramping that leads to diarrhea and nausea.    Negative rheumatologic studies.   FSH 95 12/20/22.   Taking Zoloft  and it is helpful to control panic attacks.   Rare use of Xanax .   Had cardiac calcium score of 1.  She saw cardiology and chose not to take a statin.   Father passed of CHF.  Mother moved into independent living.   PCP: Mercer Clotilda SAUNDERS, MD   No LMP recorded. Patient is perimenopausal.           Sexually active: Yes.    The current method of family planning is Mirena IUD 10-29-19.    Menopausal hormone therapy:  n/a Exercising: Yes.    Weight lifting, spin class, skiing  Smoker:  no  OB History  Gravida Para Term Preterm AB Living  2 2 2   2   SAB IAB Ectopic Multiple Live Births          # Outcome Date GA Lbr Len/2nd Weight Sex Type Anes PTL Lv  2 Term           1 Term              HEALTH MAINTENANCE: Last 2 paps:  09/11/18 neg HPV neg History of abnormal Pap or positive HPV:  no Mammogram:   01/30/24 Breast Density Cat B, BIRADS Cat 1 neg  Colonoscopy:  10/25/16 - due in 10 years.   Bone Density:  n/a  Result  n/a   Immunization History  Administered Date(s) Administered   DTaP 04/06/2010   Influenza-Unspecified 03/29/2017, 03/27/2019, 03/24/2021, 03/23/2022   Moderna Sars-Covid-2 Vaccination 07/07/2019, 08/04/2019, 05/25/2020   Pfizer Covid-19 Vaccine Bivalent Booster 9yrs & up 05/03/2021   Tdap 11/17/2020   Zoster Recombinant(Shingrix) 08/28/2018, 01/27/2019      reports that she has never smoked. She has never used smokeless tobacco. She reports current alcohol use. She reports that she does not use drugs.  Past Medical History:  Diagnosis Date   Anxiety    panic   Elevated LDL cholesterol level    Elevated serum creatinine 2020   Endometriosis    Microscopic  hematuria    January 2017    Retinal tear     Past Surgical History:  Procedure Laterality Date   abdomnioplasty  06/2003   LAPAROSCOPIC OOPHERECTOMY  2011   PELVIC LAPAROSCOPY  03-01-10   LSO secondary torsion, endometriosis--Dr. Montie Romine   RETINAL TEAR REPAIR CRYOTHERAPY Left 11/2014    Current Outpatient Medications  Medication Sig Dispense Refill   ALPRAZolam  (XANAX ) 0.25 MG tablet Take 1 tablet (0.25 mg total) by mouth 3 (three) times daily as needed for anxiety. 30 tablet 0   [START ON 04/17/2024] estradiol  (VIVELLE -DOT) 0.05 MG/24HR patch Place 1 patch (0.05 mg total) onto the skin 2 (two) times a week. 24 patch 1   levonorgestrel (MIRENA, 52 MG,) 20 MCG/DAY IUD by Intrauterine route.     sertraline  (ZOLOFT ) 50 MG tablet TAKE 1 TABLET BY MOUTH EVERY DAY 90 tablet 1   Current Facility-Administered Medications  Medication Dose Route Frequency Provider Last Rate Last Admin   0.9 %  sodium chloride  infusion  500 mL Intravenous Continuous Pyrtle, Gordy HERO, MD        ALLERGIES: Sulfa antibiotics and Penicillins  Family History  Problem Relation Age of Onset  Hypertension Father    Hyperlipidemia Mother    Osteoporosis Mother    Breast cancer Mother 70       Only tx'd w/radiation   Heart failure Maternal Grandmother        CHF   Heart failure Paternal Grandmother        CHF   Rheum arthritis Paternal Grandmother    Colon cancer Neg Hx    Esophageal cancer Neg Hx    Stomach cancer Neg Hx    Rectal cancer Neg Hx     Review of Systems  All other systems reviewed and are negative.   PHYSICAL EXAM:  BP 124/72 (BP Location: Left Arm, Patient Position: Sitting)   Pulse 74   Ht 5' 7.5 (1.715 m)   Wt 150 lb (68 kg)   SpO2 99%   BMI 23.15 kg/m     General appearance: alert, cooperative and appears stated age Head: normocephalic, without obvious abnormality, atraumatic Neck: no adenopathy, supple, symmetrical, trachea midline and thyroid  normal to inspection and  palpation Lungs: clear to auscultation bilaterally Breasts: normal appearance, no masses or tenderness, No nipple retraction or dimpling, No nipple discharge or bleeding, No axillary adenopathy Heart: regular rate and rhythm Abdomen: soft, non-tender; no masses, no organomegaly Extremities: extremities normal, atraumatic, no cyanosis or edema Skin: skin color, texture, turgor normal. No rashes or lesions Lymph nodes: cervical, supraclavicular, and axillary nodes normal. Neurologic: grossly normal  Pelvic: External genitalia:  no lesions              No abnormal inguinal nodes palpated.              Urethra:  normal appearing urethra with no masses, tenderness or lesions              Bartholins and Skenes: normal                 Vagina: normal appearing vagina with normal color and discharge, no lesions              Cervix: no lesions.  IUD threads seen.              Pap taken: yes Bimanual Exam:  Uterus:  normal size, contour, position, consistency, mobility, non-tender              Adnexa: no mass, fullness, tenderness              Rectal exam: yes.  Confirms.              Anus:  normal sphincter tone, no lesions  Chaperone was present for exam:  Kari HERO, CMA  ASSESSMENT: Well woman visit with gynecologic exam. Mirena IUD.  Amenorrhea.  Anxiety.  On Zoloft .  Uses Xanax  rarely prn.  Elevated LDL cholesterol. FH breast cancer:  Mother. PHQ-2-9: 0  PLAN: Mammogram screening discussed. Self breast awareness reviewed. Pap and HRV collected:  yes Guidelines for Calcium, Vitamin D , regular exercise program including cardiovascular and weight bearing exercise. Medication refills:  Zoloft  50 mg daily. #90, RF 3.  She will contact me if she needs refill of Xanax .  We discussed risks and benefits of HRT.   In the [facility] study, HRT in older postmenopausal women can increase risk of PE, DVT, MI, stroke and breast cancer.  Benefits of treating vasomotor symptoms, improving well being, joint  pain, and reducing risk of osteoporosis also reviewed. Will check FSH and estradiol  and if perimenopausal or postmenopausal will start Vivelle  Dot 0.05 mg  twice weekly. #24, RF 1.  Will need to consider management of her IUD.   Follow up:  3 months for hormone check and fasting cholesterol.  Annual well woman visit in 1 year.

## 2024-04-18 LAB — CYTOLOGY - PAP
Comment: NEGATIVE
Diagnosis: NEGATIVE
High risk HPV: NEGATIVE

## 2024-04-20 ENCOUNTER — Ambulatory Visit: Payer: Self-pay | Admitting: Obstetrics and Gynecology

## 2024-04-24 MED ORDER — ESTRADIOL 0.05 MG/24HR TD PTTW: 1.0000 | MEDICATED_PATCH | TRANSDERMAL | 1 refills | Status: DC

## 2024-04-24 NOTE — Telephone Encounter (Signed)
 Rx pended for estradiol  0.05 mg patch twice weekly #24/0RF

## 2024-07-29 NOTE — Progress Notes (Unsigned)
 "  GYNECOLOGY  VISIT   HPI: 59 y.o.   Married  Caucasian female   G2P2002 with No LMP recorded. Patient is perimenopausal.   here for: Follow up - Vivelle  and hormone level recheck. Feels like she can not tell a difference since staring the patches.      Wanted to loose weight with HRT.   Still with joint pain.   Her IUD needs removal and Prometrium  started.    No bleeding.   FSH 52.6 and estradiol  < 15 on 04/16/24.   FSH 95.4 and estradiol  < 15 on 12/20/22.   She has elevated cholesterol.  She may start a GLP.  Needs A1C, TSH, CMP.    Mother passed from pancreatic cancer in December.   GYNECOLOGIC HISTORY: No LMP recorded. Patient is perimenopausal. Contraception:  Mirena  IUD 10-29-19  Menopausal hormone therapy:  Vivelle   Last 2 paps:  10/825 neg, HR HPV neg, 09/11/18 neg, HPV neg  History of abnormal Pap or positive HPV:  no Mammogram:  01/30/24 Breast Density Cat B, BIRADS Cat 1 neg         OB History     Gravida  2   Para  2   Term  2   Preterm      AB      Living  2      SAB      IAB      Ectopic      Multiple      Live Births                 Patient Active Problem List   Diagnosis Date Noted   Bilateral carpal tunnel syndrome 09/05/2017   Allergic reaction 04/07/2017    Past Medical History:  Diagnosis Date   Anxiety    panic   Elevated LDL cholesterol level    Elevated serum creatinine 2020   Endometriosis    Microscopic hematuria    January 2017    Retinal tear     Past Surgical History:  Procedure Laterality Date   abdomnioplasty  06/2003   LAPAROSCOPIC OOPHERECTOMY  2011   PELVIC LAPAROSCOPY  03-01-10   LSO secondary torsion, endometriosis--Dr. Montie Romine   RETINAL TEAR REPAIR CRYOTHERAPY Left 11/2014    Current Outpatient Medications  Medication Sig Dispense Refill   ALPRAZolam  (XANAX ) 0.25 MG tablet Take 1 tablet (0.25 mg total) by mouth 3 (three) times daily as needed for anxiety. 30 tablet 0   levonorgestrel   (MIRENA , 52 MG,) 20 MCG/DAY IUD by Intrauterine route.     progesterone  (PROMETRIUM ) 100 MG capsule Take 1 capsule (100 mg total) by mouth daily. 90 capsule 2   sertraline  (ZOLOFT ) 50 MG tablet Take 1 tablet (50 mg total) by mouth daily. 90 tablet 3   [START ON 07/31/2024] estradiol  (VIVELLE -DOT) 0.05 MG/24HR patch Place 1 patch (0.05 mg total) onto the skin 2 (two) times a week. 24 patch 2   Current Facility-Administered Medications  Medication Dose Route Frequency Provider Last Rate Last Admin   0.9 %  sodium chloride  infusion  500 mL Intravenous Continuous Pyrtle, Gordy HERO, MD         ALLERGIES: Sulfa antibiotics and Penicillins  Family History  Problem Relation Age of Onset   Cancer Mother        pancreatic cancer   Hyperlipidemia Mother    Osteoporosis Mother    Breast cancer Mother 56       Only tx'd w/radiation   Hypertension Father  Heart failure Maternal Grandmother        CHF   Heart failure Paternal Grandmother        CHF   Rheum arthritis Paternal Grandmother    Colon cancer Neg Hx    Esophageal cancer Neg Hx    Stomach cancer Neg Hx    Rectal cancer Neg Hx     Social History   Socioeconomic History   Marital status: Married    Spouse name: Not on file   Number of children: 2   Years of education: Not on file   Highest education level: Professional school degree (e.g., MD, DDS, DVM, JD)  Occupational History   Occupation: Dentist    Occupation: Dentist  Tobacco Use   Smoking status: Never   Smokeless tobacco: Never  Vaping Use   Vaping status: Never Used  Substance and Sexual Activity   Alcohol use: Yes    Alcohol/week: 0.0 standard drinks of alcohol    Comment: 1-2 drinks per month   Drug use: No   Sexual activity: Yes    Partners: Male    Birth control/protection: I.U.D.    Comment: Mirena  IUD 10-29-19  Other Topics Concern   Not on file  Social History Narrative   Not on file   Social Drivers of Health   Tobacco Use: Low Risk (07/30/2024)    Patient History    Smoking Tobacco Use: Never    Smokeless Tobacco Use: Never    Passive Exposure: Not on file  Financial Resource Strain: Low Risk (12/04/2023)   Overall Financial Resource Strain (CARDIA)    Difficulty of Paying Living Expenses: Not hard at all  Food Insecurity: No Food Insecurity (12/04/2023)   Hunger Vital Sign    Worried About Running Out of Food in the Last Year: Never true    Ran Out of Food in the Last Year: Never true  Transportation Needs: No Transportation Needs (12/04/2023)   PRAPARE - Administrator, Civil Service (Medical): No    Lack of Transportation (Non-Medical): No  Physical Activity: Sufficiently Active (12/04/2023)   Exercise Vital Sign    Days of Exercise per Week: 5 days    Minutes of Exercise per Session: 50 min  Stress: Stress Concern Present (12/04/2023)   [employer] of Occupational Health - Occupational Stress Questionnaire    Feeling of Stress : Very much  Social Connections: Socially Integrated (12/04/2023)   Social Connection and Isolation Panel    Frequency of Communication with Friends and Family: More than three times a week    Frequency of Social Gatherings with Friends and Family: More than three times a week    Attends Religious Services: More than 4 times per year    Active Member of Clubs or Organizations: Yes    Attends Banker Meetings: More than 4 times per year    Marital Status: Married  Catering Manager Violence: Not on file  Depression (PHQ2-9): Low Risk (04/16/2024)   Depression (PHQ2-9)    PHQ-2 Score: 0  Alcohol Screen: Low Risk (12/04/2023)   Alcohol Screen    Last Alcohol Screening Score (AUDIT): 2  Housing: Low Risk (12/04/2023)   Housing Stability Vital Sign    Unable to Pay for Housing in the Last Year: No    Number of Times Moved in the Last Year: 0    Homeless in the Last Year: No  Utilities: Not on file  Health Literacy: Not on file    Review of Systems  All other systems  reviewed and are negative.   PHYSICAL EXAMINATION:   BP 124/76 (BP Location: Left Arm, Patient Position: Sitting)   Pulse 62   SpO2 99%     General appearance: alert, cooperative and appears stated age  ASSESSMENT:  HRT.  Mirena  expiring.  Weight gain.  Hx elevated cholesterol.  Bereavement.   PLAN:  Discused WHI and use of HRT which can potentially increase risk of PE, DVT, MI, stroke and breast cancer.  Benefits of treating vasomotor symptoms and reducing risk of osteoporosis also reviewed. Vivelle  Dot 0.05 mg twice weekly.  #24, RF 2. Prometrium  100 mg q hs.  #90, RF 2. Return for IUD removal.  Check lipids, CMP, TSH, A1C.  Bereavement support given.   35 min  total time was spent for this patient encounter, including preparation, face-to-face counseling with the patient, coordination of care, and documentation of the encounter.    "

## 2024-07-30 ENCOUNTER — Ambulatory Visit (INDEPENDENT_AMBULATORY_CARE_PROVIDER_SITE_OTHER): Admitting: Obstetrics and Gynecology

## 2024-07-30 ENCOUNTER — Encounter: Payer: Self-pay | Admitting: Obstetrics and Gynecology

## 2024-07-30 VITALS — BP 124/76 | HR 62

## 2024-07-30 DIAGNOSIS — E7800 Pure hypercholesterolemia, unspecified: Secondary | ICD-10-CM | POA: Diagnosis not present

## 2024-07-30 DIAGNOSIS — R635 Abnormal weight gain: Secondary | ICD-10-CM

## 2024-07-30 DIAGNOSIS — Z79890 Hormone replacement therapy (postmenopausal): Secondary | ICD-10-CM | POA: Diagnosis not present

## 2024-07-30 LAB — COMPREHENSIVE METABOLIC PANEL WITH GFR
AG Ratio: 1.8 (calc) (ref 1.0–2.5)
ALT: 14 U/L (ref 6–29)
AST: 15 U/L (ref 10–35)
Albumin: 4.3 g/dL (ref 3.6–5.1)
Alkaline phosphatase (APISO): 48 U/L (ref 37–153)
BUN: 14 mg/dL (ref 7–25)
CO2: 27 mmol/L (ref 20–32)
Calcium: 9.1 mg/dL (ref 8.6–10.4)
Chloride: 107 mmol/L (ref 98–110)
Creat: 0.85 mg/dL (ref 0.50–1.03)
Globulin: 2.4 g/dL (ref 1.9–3.7)
Glucose, Bld: 78 mg/dL (ref 65–99)
Potassium: 4.8 mmol/L (ref 3.5–5.3)
Sodium: 141 mmol/L (ref 135–146)
Total Bilirubin: 0.4 mg/dL (ref 0.2–1.2)
Total Protein: 6.7 g/dL (ref 6.1–8.1)
eGFR: 79 mL/min/1.73m2

## 2024-07-30 LAB — LIPID PANEL
Cholesterol: 258 mg/dL — ABNORMAL HIGH
HDL: 57 mg/dL
LDL Cholesterol (Calc): 179 mg/dL — ABNORMAL HIGH
Non-HDL Cholesterol (Calc): 201 mg/dL — ABNORMAL HIGH
Total CHOL/HDL Ratio: 4.5 (calc)
Triglycerides: 103 mg/dL

## 2024-07-30 LAB — TSH: TSH: 1.8 m[IU]/L (ref 0.40–4.50)

## 2024-07-30 LAB — HEMOGLOBIN A1C
Hgb A1c MFr Bld: 5.6 %
Mean Plasma Glucose: 114 mg/dL
eAG (mmol/L): 6.3 mmol/L

## 2024-07-30 MED ORDER — PROGESTERONE MICRONIZED 100 MG PO CAPS: 100.0000 mg | ORAL_CAPSULE | Freq: Every day | ORAL | 2 refills | Status: AC

## 2024-07-30 MED ORDER — ESTRADIOL 0.05 MG/24HR TD PTTW
1.0000 | MEDICATED_PATCH | TRANSDERMAL | 2 refills | Status: AC
  Filled 2024-08-06: qty 8, 28d supply, fill #0

## 2024-07-31 ENCOUNTER — Ambulatory Visit: Payer: Self-pay | Admitting: Obstetrics and Gynecology

## 2024-08-06 ENCOUNTER — Other Ambulatory Visit (HOSPITAL_BASED_OUTPATIENT_CLINIC_OR_DEPARTMENT_OTHER): Payer: Self-pay

## 2024-11-05 ENCOUNTER — Ambulatory Visit: Admitting: Obstetrics and Gynecology

## 2025-05-13 ENCOUNTER — Ambulatory Visit: Admitting: Obstetrics and Gynecology
